# Patient Record
Sex: Male | Born: 1980
Health system: Southern US, Community
[De-identification: ages and names within clinical notes are randomized; demographics above are authoritative.]

## PROBLEM LIST (undated history)

## (undated) DIAGNOSIS — I10 Essential (primary) hypertension: Secondary | ICD-10-CM

## (undated) DIAGNOSIS — IMO0002 Reserved for concepts with insufficient information to code with codable children: Secondary | ICD-10-CM

## (undated) HISTORY — PX: WISDOM TOOTH EXTRACTION: SHX21

---

## 2011-05-31 ENCOUNTER — Inpatient Hospital Stay (INDEPENDENT_AMBULATORY_CARE_PROVIDER_SITE_OTHER)
Admission: RE | Admit: 2011-05-31 | Discharge: 2011-05-31 | Disposition: A | Discharge status: Home / Self Care | Payer: 59 | Source: Ambulatory Visit | Attending: Emergency Medicine | Admitting: Emergency Medicine

## 2011-05-31 DIAGNOSIS — L738 Other specified follicular disorders: Secondary | ICD-10-CM

## 2011-08-15 ENCOUNTER — Emergency Department (HOSPITAL_COMMUNITY)
Admission: EM | Admit: 2011-08-15 | Discharge: 2011-08-15 | Disposition: A | Discharge status: Home / Self Care | Payer: 59 | Attending: Emergency Medicine | Admitting: Emergency Medicine

## 2011-08-15 ENCOUNTER — Other Ambulatory Visit: Payer: Self-pay

## 2011-08-15 ENCOUNTER — Encounter: Payer: Self-pay | Admitting: Emergency Medicine

## 2011-08-15 DIAGNOSIS — J45909 Unspecified asthma, uncomplicated: Secondary | ICD-10-CM | POA: Insufficient documentation

## 2011-08-15 DIAGNOSIS — I10 Essential (primary) hypertension: Secondary | ICD-10-CM | POA: Insufficient documentation

## 2011-08-15 DIAGNOSIS — R059 Cough, unspecified: Secondary | ICD-10-CM | POA: Insufficient documentation

## 2011-08-15 DIAGNOSIS — K921 Melena: Secondary | ICD-10-CM | POA: Insufficient documentation

## 2011-08-15 DIAGNOSIS — R05 Cough: Secondary | ICD-10-CM | POA: Insufficient documentation

## 2011-08-15 DIAGNOSIS — K219 Gastro-esophageal reflux disease without esophagitis: Secondary | ICD-10-CM | POA: Insufficient documentation

## 2011-08-15 DIAGNOSIS — J3489 Other specified disorders of nose and nasal sinuses: Secondary | ICD-10-CM | POA: Insufficient documentation

## 2011-08-15 DIAGNOSIS — K279 Peptic ulcer, site unspecified, unspecified as acute or chronic, without hemorrhage or perforation: Secondary | ICD-10-CM | POA: Insufficient documentation

## 2011-08-15 DIAGNOSIS — Z79899 Other long term (current) drug therapy: Secondary | ICD-10-CM | POA: Insufficient documentation

## 2011-08-15 DIAGNOSIS — R0982 Postnasal drip: Secondary | ICD-10-CM | POA: Insufficient documentation

## 2011-08-15 DIAGNOSIS — J069 Acute upper respiratory infection, unspecified: Secondary | ICD-10-CM | POA: Insufficient documentation

## 2011-08-15 HISTORY — DX: Reserved for concepts with insufficient information to code with codable children: IMO0002

## 2011-08-15 HISTORY — DX: Essential (primary) hypertension: I10

## 2011-08-15 LAB — OCCULT BLOOD, POC DEVICE: Fecal Occult Bld: NEGATIVE

## 2011-08-15 MED ORDER — ALBUTEROL SULFATE HFA 108 (90 BASE) MCG/ACT IN AERS: 2.0000 | INHALATION_SPRAY | RESPIRATORY_TRACT | Status: DC | PRN

## 2011-08-15 NOTE — ED Notes (Signed)
Pt reports congestion x 3 week. Dark tarry stools on Wednesday. Pt denies abdominal pain, N/V.

## 2011-08-15 NOTE — ED Provider Notes (Signed)
History   Patient presents with two weeks of nasal congestion, post nasal drip, and bronchospasm. No fever, chills, SOB, hemoptysis, recent travel or ill contacts. No relief with mucinex.   Patient with hx of GERD vs PUD (no prior GI w/u) has been managed on omeprazole. Noted two episodes of dark tarry stools this week, with normal BM today. No BRBPR, n/v/d. No change in appetite. No abdominal pain at present. No urinary sx  CSN: [id number] Arrival date & time: 08/15/2011  5:04 PM   First MD Initiated Contact with Patient 08/15/11 1908      Chief Complaint  Patient presents with  . Nasal Congestion  . Melena    (Consider location/radiation/quality/duration/timing/severity/associated sxs/prior treatment) Patient is a 30 y.o. male presenting with hematochezia. The history is provided by the patient and the spouse.  Rectal Bleeding  The current episode started 3 to 5 days ago. The onset was sudden. The problem occurs occasionally. The problem has been rapidly improving. The patient is experiencing no pain. The stool is described as soft and hard. Associated symptoms include coughing. Pertinent negatives include no anorexia, no fever, no abdominal pain, no diarrhea, no hematemesis, no hemorrhoids, no nausea, no rectal pain, no vomiting, no hematuria, no chest pain and no difficulty breathing.    Past Medical History  Diagnosis Date  . Asthma   . Hypertension   . Ulcer     History reviewed. No pertinent past surgical history.  History reviewed. No pertinent family history.  History  Substance Use Topics  . Smoking status: Never Smoker   . Smokeless tobacco: Not on file  . Alcohol Use: Yes     Occassional      Review of Systems  Constitutional: Negative for fever, chills, activity change, appetite change and fatigue.  Respiratory: Positive for cough. Negative for chest tightness and shortness of breath.   Cardiovascular: Negative for chest pain.  Gastrointestinal: Positive  for hematochezia. Negative for nausea, vomiting, abdominal pain, diarrhea, abdominal distention, rectal pain, anorexia, hematemesis and hemorrhoids.  Genitourinary: Negative for dysuria, frequency, hematuria, flank pain and difficulty urinating.  Musculoskeletal: Negative for back pain.  Neurological: Negative for syncope.  Hematological: Does not bruise/bleed easily.    Allergies  Review of patient's allergies indicates no known allergies.  Home Medications   Current Outpatient Rx  Name Route Sig Dispense Refill  . SUPER B COMPLEX PO Oral Take 1 tablet by mouth daily.      [name] Kitchen HYDROCHLOROTHIAZIDE 25 MG PO TABS Oral Take 25 mg by mouth daily.      [name] Kitchen LOSARTAN POTASSIUM 50 MG PO TABS Oral Take 50 mg by mouth daily.      Carma Leaven M PLUS PO TABS Oral Take 1 tablet by mouth daily.      [name] Kitchen OMEPRAZOLE 20 MG PO CPDR Oral Take 20 mg by mouth daily.        BP 171/84  Pulse 112  Temp(Src) 98.5 F (36.9 C) (Oral)  Resp 20  SpO2 100%  Physical Exam  Constitutional: He is oriented to person, place, and time. He appears well-developed and well-nourished. No distress.  HENT:  Head: Normocephalic and atraumatic.  Mouth/Throat: Oropharynx is clear and moist. No oropharyngeal exudate.       Cobblestoning of the pharynx with clear postnasal drip noted.  Eyes: Conjunctivae and EOM are normal. Pupils are equal, round, and reactive to light.  Cardiovascular: Normal rate and regular rhythm.   Pulmonary/Chest: Effort normal and breath sounds normal. No respiratory distress.  He has no wheezes. He has no rales. He exhibits no tenderness.  Abdominal: He exhibits no distension and no mass. There is no tenderness. There is no rebound and no guarding.  Genitourinary: Prostate normal. Guaiac negative stool.       Rectal exam normal nontender no hemorrhoids  Musculoskeletal: Normal range of motion. He exhibits no edema and no tenderness.  Neurological: He is alert and oriented to person, place, and time.  Skin:  Skin is warm and dry. No rash noted. He is not diaphoretic. No erythema. No pallor.    ED Course  Procedures (including critical care time)  Labs Reviewed - No data to display No results found.   No diagnosis found.    MDM  Patient HD stable without signs of acute abd or active GI bleed. Hemoccult negative. No indication for further work up including labs or imaging on an emergent basis. Patient encouraged to follow up with GI for possible endoscopy, and he and his wife were comfortable with plan.  URI sx without evidence of PNA or indication for abx. Provided albuterol MDI. Patient to follow up with PMD if symptoms persist.         Marcell Anger, PA 08/16/11 0001

## 2011-08-16 NOTE — ED Provider Notes (Signed)
Medical screening examination/treatment/procedure(s) were performed by non-physician practitioner and as supervising physician I was immediately available for consultation/collaboration.   Lyanne Co, MD 08/16/11 0040

## 2011-08-17 ENCOUNTER — Encounter: Payer: Self-pay | Admitting: Gastroenterology

## 2011-09-11 ENCOUNTER — Ambulatory Visit: Payer: 59 | Admitting: Gastroenterology

## 2011-09-15 ENCOUNTER — Ambulatory Visit: Payer: 59 | Admitting: Gastroenterology

## 2011-12-25 ENCOUNTER — Emergency Department (HOSPITAL_COMMUNITY)
Admission: EM | Admit: 2011-12-25 | Discharge: 2011-12-25 | Disposition: A | Discharge status: Home / Self Care | Payer: 59 | Source: Home / Self Care | Attending: Family Medicine | Admitting: Family Medicine

## 2011-12-25 ENCOUNTER — Encounter (HOSPITAL_COMMUNITY): Payer: Self-pay

## 2011-12-25 ENCOUNTER — Emergency Department (INDEPENDENT_AMBULATORY_CARE_PROVIDER_SITE_OTHER): Payer: 59

## 2011-12-25 DIAGNOSIS — J310 Chronic rhinitis: Secondary | ICD-10-CM

## 2011-12-25 MED ORDER — DOXYCYCLINE HYCLATE 100 MG PO CAPS: 100.0000 mg | ORAL_CAPSULE | Freq: Two times a day (BID) | ORAL | Status: AC

## 2011-12-25 NOTE — ED Provider Notes (Signed)
History     CSN: [id number]  Arrival date & time 12/25/11  1234   First MD Initiated Contact with Patient 12/25/11 1251      Chief Complaint  Patient presents with  . Facial Pain    (Consider location/radiation/quality/duration/timing/severity/associated sxs/prior treatment) Patient is a 31 y.o. male presenting with URI. The history is provided by the patient and a parent.  URI The primary symptoms include cough. Primary symptoms do not include fever, nausea or vomiting. The current episode started more than 1 week ago. This is a new problem. The problem has been gradually worsening (rx for 5 days of omnicef, , cxr neg, still with sinus cong, drainage, cough).  Symptoms associated with the illness include facial pain, sinus pressure, congestion and rhinorrhea.    Past Medical History  Diagnosis Date  . Asthma   . Hypertension   . Ulcer     History reviewed. No pertinent past surgical history.  History reviewed. No pertinent family history.  History  Substance Use Topics  . Smoking status: Never Smoker   . Smokeless tobacco: Not on file  . Alcohol Use: Yes     Occassional      Review of Systems  Constitutional: Negative for fever.  HENT: Positive for congestion, rhinorrhea, postnasal drip and sinus pressure.   Respiratory: Positive for cough.   Gastrointestinal: Negative for nausea and vomiting.    Allergies  Review of patient's allergies indicates no known allergies.  Home Medications   Current Outpatient Rx  Name Route Sig Dispense Refill  . ALBUTEROL SULFATE HFA 108 (90 BASE) MCG/ACT IN AERS Inhalation Inhale 2 puffs into the lungs every 4 (four) hours as needed for wheezing or shortness of breath. 1 Inhaler 0  . SUPER B COMPLEX PO Oral Take 1 tablet by mouth daily.      [name] Kitchen DOXYCYCLINE HYCLATE 100 MG PO CAPS Oral Take 1 capsule (100 mg total) by mouth 2 (two) times daily. 20 capsule 1  . HYDROCHLOROTHIAZIDE 25 MG PO TABS Oral Take 25 mg by mouth daily.        [name] Kitchen LOSARTAN POTASSIUM 50 MG PO TABS Oral Take 50 mg by mouth daily.      Carma Leaven M PLUS PO TABS Oral Take 1 tablet by mouth daily.      [name] Kitchen OMEPRAZOLE 20 MG PO CPDR Oral Take 20 mg by mouth daily.        BP 137/77  Pulse 82  Temp(Src) 98.1 F (36.7 C) (Oral)  Resp 16  SpO2 96%  Physical Exam  Nursing note and vitals reviewed. Constitutional: He is oriented to person, place, and time. He appears well-developed and well-nourished.  HENT:  Head: Normocephalic.  Right Ear: External ear normal.  Left Ear: External ear normal.  Nose: Mucosal edema present. Right sinus exhibits maxillary sinus tenderness and frontal sinus tenderness. Left sinus exhibits maxillary sinus tenderness and frontal sinus tenderness.  Mouth/Throat: Oropharynx is clear and moist.  Eyes: Conjunctivae are normal. Pupils are equal, round, and reactive to light.  Neck: Normal range of motion. Neck supple.  Cardiovascular: Normal rate, normal heart sounds and intact distal pulses.   Pulmonary/Chest: Effort normal and breath sounds normal.  Lymphadenopathy:    He has no cervical adenopathy.  Neurological: He is alert and oriented to person, place, and time.  Skin: Skin is warm and dry.  Psychiatric: He has a normal mood and affect.    ED Course  Procedures (including critical care time)  Labs Reviewed -  No data to display No results found.   1. Purulent rhinitis       MDM  X-rays reviewed and report per radiologist.         Linna Hoff, MD 12/30/11 2049

## 2011-12-25 NOTE — ED Notes (Signed)
States he has been having issues for a month w HA, sinus pressure, clear/green secretions, dizziness; saw his primary(Daniel Jarold Motto) and had round of omnicef and a chest x-ray, w/o resolution of issues

## 2012-10-26 ENCOUNTER — Other Ambulatory Visit: Payer: Self-pay | Admitting: Orthopedic Surgery

## 2012-10-26 ENCOUNTER — Ambulatory Visit
Admission: RE | Admit: 2012-10-26 | Discharge: 2012-10-26 | Disposition: A | Discharge status: Home / Self Care | Payer: 59 | Source: Ambulatory Visit | Attending: Orthopedic Surgery | Admitting: Orthopedic Surgery

## 2012-10-26 DIAGNOSIS — M545 Low back pain: Secondary | ICD-10-CM

## 2012-10-28 ENCOUNTER — Other Ambulatory Visit: Payer: Self-pay | Admitting: Orthopedic Surgery

## 2012-10-31 ENCOUNTER — Encounter (HOSPITAL_COMMUNITY): Payer: Self-pay | Admitting: Pharmacy Technician

## 2012-11-01 ENCOUNTER — Encounter (HOSPITAL_COMMUNITY): Payer: Self-pay | Admitting: *Deleted

## 2012-11-01 ENCOUNTER — Other Ambulatory Visit (HOSPITAL_COMMUNITY): Payer: 59

## 2012-11-01 MED ORDER — CEFAZOLIN SODIUM-DEXTROSE 2-3 GM-% IV SOLR
2.0000 g | INTRAVENOUS | Status: AC
  Administered 2012-11-02: 2 g via INTRAVENOUS
  Filled 2012-11-01: qty 50

## 2012-11-02 ENCOUNTER — Ambulatory Visit (HOSPITAL_COMMUNITY): Payer: 59 | Admitting: Anesthesiology

## 2012-11-02 ENCOUNTER — Encounter (HOSPITAL_COMMUNITY)
Admission: RE | Disposition: A | Discharge status: Home / Self Care | Payer: Self-pay | Source: Ambulatory Visit | Attending: Orthopedic Surgery

## 2012-11-02 ENCOUNTER — Ambulatory Visit (HOSPITAL_COMMUNITY): Payer: 59

## 2012-11-02 ENCOUNTER — Ambulatory Visit (HOSPITAL_COMMUNITY)
Admission: RE | Admit: 2012-11-02 | Discharge: 2012-11-02 | Disposition: A | Discharge status: Home / Self Care | Payer: 59 | Source: Ambulatory Visit | Attending: Orthopedic Surgery | Admitting: Orthopedic Surgery

## 2012-11-02 ENCOUNTER — Encounter (HOSPITAL_COMMUNITY): Payer: Self-pay | Admitting: Anesthesiology

## 2012-11-02 DIAGNOSIS — M5126 Other intervertebral disc displacement, lumbar region: Secondary | ICD-10-CM | POA: Insufficient documentation

## 2012-11-02 DIAGNOSIS — I10 Essential (primary) hypertension: Secondary | ICD-10-CM | POA: Insufficient documentation

## 2012-11-02 DIAGNOSIS — Z79899 Other long term (current) drug therapy: Secondary | ICD-10-CM | POA: Insufficient documentation

## 2012-11-02 DIAGNOSIS — J45909 Unspecified asthma, uncomplicated: Secondary | ICD-10-CM | POA: Insufficient documentation

## 2012-11-02 HISTORY — PX: LUMBAR LAMINECTOMY/DECOMPRESSION MICRODISCECTOMY: SHX5026

## 2012-11-02 LAB — COMPREHENSIVE METABOLIC PANEL
ALT: 38 U/L (ref 0–53)
Albumin: 3.7 g/dL (ref 3.5–5.2)
Alkaline Phosphatase: 85 U/L (ref 39–117)
BUN: 11 mg/dL (ref 6–23)
Potassium: 3.5 mEq/L (ref 3.5–5.1)
Sodium: 139 mEq/L (ref 135–145)
Total Protein: 7.6 g/dL (ref 6.0–8.3)

## 2012-11-02 LAB — CBC WITH DIFFERENTIAL/PLATELET
Basophils Absolute: 0.1 10*3/uL (ref 0.0–0.1)
Basophils Relative: 1 % (ref 0–1)
Eosinophils Absolute: 0.2 10*3/uL (ref 0.0–0.7)
MCH: 29.4 pg (ref 26.0–34.0)
MCHC: 34.8 g/dL (ref 30.0–36.0)
Neutrophils Relative %: 59 % (ref 43–77)
Platelets: 261 10*3/uL (ref 150–400)
RDW: 13.6 % (ref 11.5–15.5)

## 2012-11-02 LAB — TYPE AND SCREEN: Antibody Screen: NEGATIVE

## 2012-11-02 LAB — SURGICAL PCR SCREEN
MRSA, PCR: NEGATIVE
Staphylococcus aureus: NEGATIVE

## 2012-11-02 LAB — PROTIME-INR: Prothrombin Time: 12.4 seconds (ref 11.6–15.2)

## 2012-11-02 SURGERY — LUMBAR LAMINECTOMY/DECOMPRESSION MICRODISCECTOMY
Anesthesia: General | Site: Spine Lumbar | Laterality: Left | Wound class: Clean

## 2012-11-02 MED ORDER — PROMETHAZINE HCL 25 MG/ML IJ SOLN
6.2500 mg | Freq: Once | INTRAMUSCULAR | Status: AC
  Administered 2012-11-02: 6.25 mg via INTRAVENOUS

## 2012-11-02 MED ORDER — LIDOCAINE HCL (CARDIAC) 20 MG/ML IV SOLN
INTRAVENOUS | Status: DC | PRN
  Administered 2012-11-02: 100 mg via INTRAVENOUS

## 2012-11-02 MED ORDER — MIDAZOLAM HCL 5 MG/5ML IJ SOLN
INTRAMUSCULAR | Status: DC | PRN
  Administered 2012-11-02: 2 mg via INTRAVENOUS

## 2012-11-02 MED ORDER — OXYCODONE HCL 5 MG PO TABS: 5.0000 mg | ORAL_TABLET | Freq: Once | ORAL | Status: DC | PRN

## 2012-11-02 MED ORDER — LIDOCAINE HCL 4 % MT SOLN
OROMUCOSAL | Status: DC | PRN
  Administered 2012-11-02: 4 mL via TOPICAL

## 2012-11-02 MED ORDER — PHENYLEPHRINE HCL 10 MG/ML IJ SOLN
10.0000 mg | INTRAVENOUS | Status: DC | PRN
  Administered 2012-11-02: 20 ug/min via INTRAVENOUS

## 2012-11-02 MED ORDER — LACTATED RINGERS IV SOLN
INTRAVENOUS | Status: DC | PRN
  Administered 2012-11-02 (×3): via INTRAVENOUS

## 2012-11-02 MED ORDER — PHENOL 1.4 % MT LIQD: 1.0000 | OROMUCOSAL | Status: DC | PRN

## 2012-11-02 MED ORDER — MENTHOL 3 MG MT LOZG: 1.0000 | LOZENGE | OROMUCOSAL | Status: DC | PRN

## 2012-11-02 MED ORDER — HEMOSTATIC AGENTS (NO CHARGE) OPTIME
TOPICAL | Status: DC | PRN
  Administered 2012-11-02: 1 via TOPICAL

## 2012-11-02 MED ORDER — ARTIFICIAL TEARS OP OINT
TOPICAL_OINTMENT | OPHTHALMIC | Status: DC | PRN
  Administered 2012-11-02: 1 via OPHTHALMIC

## 2012-11-02 MED ORDER — ALBUMIN HUMAN 5 % IV SOLN
INTRAVENOUS | Status: DC | PRN
  Administered 2012-11-02: 14:00:00 via INTRAVENOUS

## 2012-11-02 MED ORDER — OXYCODONE HCL 5 MG/5ML PO SOLN
5.0000 mg | Freq: Once | ORAL | Status: DC | PRN
Start: 2012-11-02 — End: 2012-11-02

## 2012-11-02 MED ORDER — HYDROMORPHONE HCL PF 1 MG/ML IJ SOLN
0.2500 mg | INTRAMUSCULAR | Status: DC | PRN
  Administered 2012-11-02 (×2): 0.5 mg via INTRAVENOUS

## 2012-11-02 MED ORDER — PROPOFOL 10 MG/ML IV BOLUS
INTRAVENOUS | Status: DC | PRN
  Administered 2012-11-02: 200 mg via INTRAVENOUS

## 2012-11-02 MED ORDER — ONDANSETRON HCL 4 MG/2ML IJ SOLN
INTRAMUSCULAR | Status: DC | PRN
  Administered 2012-11-02: 4 mg via INTRAVENOUS

## 2012-11-02 MED ORDER — THROMBIN 20000 UNITS EX SOLR
CUTANEOUS | Status: DC | PRN
  Administered 2012-11-02: 14:00:00 via TOPICAL

## 2012-11-02 MED ORDER — METHYLPREDNISOLONE ACETATE 40 MG/ML IJ SUSP
INTRAMUSCULAR | Status: DC | PRN
  Administered 2012-11-02: 1 mL

## 2012-11-02 MED ORDER — NEOSTIGMINE METHYLSULFATE 1 MG/ML IJ SOLN
INTRAMUSCULAR | Status: DC | PRN
  Administered 2012-11-02: 5 mg via INTRAVENOUS

## 2012-11-02 MED ORDER — EPHEDRINE SULFATE 50 MG/ML IJ SOLN
INTRAMUSCULAR | Status: DC | PRN
  Administered 2012-11-02 (×3): 5 mg via INTRAVENOUS

## 2012-11-02 MED ORDER — LACTATED RINGERS IV SOLN
INTRAVENOUS | Status: DC
  Administered 2012-11-02: 10:00:00 via INTRAVENOUS

## 2012-11-02 MED ORDER — ACETAMINOPHEN 10 MG/ML IV SOLN
1000.0000 mg | Freq: Once | INTRAVENOUS | Status: DC
  Filled 2012-11-02: qty 100

## 2012-11-02 MED ORDER — ROCURONIUM BROMIDE 100 MG/10ML IV SOLN
INTRAVENOUS | Status: DC | PRN
  Administered 2012-11-02: 50 mg via INTRAVENOUS
  Administered 2012-11-02 (×2): 20 mg via INTRAVENOUS

## 2012-11-02 MED ORDER — FENTANYL CITRATE 0.05 MG/ML IJ SOLN
INTRAMUSCULAR | Status: DC | PRN
  Administered 2012-11-02: 150 ug via INTRAVENOUS
  Administered 2012-11-02: 50 ug via INTRAVENOUS

## 2012-11-02 MED ORDER — BUPIVACAINE-EPINEPHRINE 0.25% -1:200000 IJ SOLN
INTRAMUSCULAR | Status: DC | PRN
  Administered 2012-11-02: 14 mL

## 2012-11-02 MED ORDER — MUPIROCIN 2 % EX OINT
TOPICAL_OINTMENT | Freq: Two times a day (BID) | CUTANEOUS | Status: DC
  Filled 2012-11-02 (×2): qty 22

## 2012-11-02 MED ORDER — INDIGOTINDISULFONATE SODIUM 8 MG/ML IJ SOLN
INTRAMUSCULAR | Status: DC | PRN
  Administered 2012-11-02: 1 mL via INTRAVENOUS

## 2012-11-02 MED ORDER — POVIDONE-IODINE 7.5 % EX SOLN
Freq: Once | CUTANEOUS | Status: DC
  Filled 2012-11-02: qty 118

## 2012-11-02 MED ORDER — PROMETHAZINE HCL 25 MG/ML IJ SOLN
INTRAMUSCULAR | Status: DC
Start: 2012-11-02 — End: 2012-11-02
  Filled 2012-11-02: qty 1

## 2012-11-02 MED ORDER — ACETAMINOPHEN 10 MG/ML IV SOLN
INTRAVENOUS | Status: AC
  Administered 2012-11-02: 1000 mg via INTRAVENOUS
  Filled 2012-11-02: qty 100

## 2012-11-02 MED ORDER — GLYCOPYRROLATE 0.2 MG/ML IJ SOLN
INTRAMUSCULAR | Status: DC | PRN
  Administered 2012-11-02: 0.6 mg via INTRAVENOUS

## 2012-11-02 MED ORDER — MIDAZOLAM HCL 5 MG/5ML IJ SOLN: INTRAMUSCULAR | Status: DC | PRN

## 2012-11-02 MED ORDER — PHENYLEPHRINE HCL 10 MG/ML IJ SOLN
INTRAMUSCULAR | Status: DC | PRN
  Administered 2012-11-02 (×3): 40 ug via INTRAVENOUS
  Administered 2012-11-02: 80 ug via INTRAVENOUS
  Administered 2012-11-02 (×3): 40 ug via INTRAVENOUS

## 2012-11-02 MED ORDER — HYDROMORPHONE HCL PF 1 MG/ML IJ SOLN
INTRAMUSCULAR | Status: AC
  Filled 2012-11-02: qty 1

## 2012-11-02 SURGICAL SUPPLY — 66 items
BENZOIN TINCTURE PRP APPL 2/3 (GAUZE/BANDAGES/DRESSINGS) ×2 IMPLANT
BUR ROUND PRECISION 4.0 (BURR) ×2 IMPLANT
CANISTER SUCTION 2500CC (MISCELLANEOUS) ×2 IMPLANT
CLOTH BEACON ORANGE TIMEOUT ST (SAFETY) ×2 IMPLANT
CLSR STERI-STRIP ANTIMIC 1/2X4 (GAUZE/BANDAGES/DRESSINGS) ×2 IMPLANT
CORDS BIPOLAR (ELECTRODE) ×2 IMPLANT
COVER SURGICAL LIGHT HANDLE (MISCELLANEOUS) ×2 IMPLANT
DRAIN CHANNEL 15F RND FF W/TCR (WOUND CARE) IMPLANT
DRAPE POUCH INSTRU U-SHP 10X18 (DRAPES) ×2 IMPLANT
DRAPE SURG 17X23 STRL (DRAPES) ×6 IMPLANT
DURAPREP 26ML APPLICATOR (WOUND CARE) ×2 IMPLANT
ELECT BLADE 4.0 EZ CLEAN MEGAD (MISCELLANEOUS) ×2
ELECT CAUTERY BLADE 6.4 (BLADE) ×2 IMPLANT
ELECT REM PT RETURN 9FT ADLT (ELECTROSURGICAL) ×2
ELECTRODE BLDE 4.0 EZ CLN MEGD (MISCELLANEOUS) ×1 IMPLANT
ELECTRODE REM PT RTRN 9FT ADLT (ELECTROSURGICAL) ×1 IMPLANT
EVACUATOR SILICONE 100CC (DRAIN) IMPLANT
FILTER STRAW FLUID ASPIR (MISCELLANEOUS) ×2 IMPLANT
GAUZE SPONGE 4X4 16PLY XRAY LF (GAUZE/BANDAGES/DRESSINGS) ×2 IMPLANT
GLOVE BIO SURGEON STRL SZ7 (GLOVE) ×2 IMPLANT
GLOVE BIO SURGEON STRL SZ7.5 (GLOVE) ×2 IMPLANT
GLOVE BIO SURGEON STRL SZ8 (GLOVE) ×4 IMPLANT
GLOVE BIOGEL PI IND STRL 8 (GLOVE) ×2 IMPLANT
GLOVE BIOGEL PI INDICATOR 8 (GLOVE) ×2
GLOVE BIOGEL PI ORTHO PRO SZ7 (GLOVE) ×1
GLOVE PI ORTHO PRO STRL SZ7 (GLOVE) ×1 IMPLANT
GLOVE SURG SS PI 7.0 STRL IVOR (GLOVE) ×4 IMPLANT
GOWN PREVENTION PLUS LG XLONG (DISPOSABLE) IMPLANT
GOWN PREVENTION PLUS XLARGE (GOWN DISPOSABLE) ×2 IMPLANT
GOWN STRL NON-REIN LRG LVL3 (GOWN DISPOSABLE) ×2 IMPLANT
GOWN STRL REIN XL XLG (GOWN DISPOSABLE) ×4 IMPLANT
IV CATH 14GX2 1/4 (CATHETERS) ×2 IMPLANT
KIT BASIN OR (CUSTOM PROCEDURE TRAY) ×2 IMPLANT
KIT ROOM TURNOVER OR (KITS) ×2 IMPLANT
NEEDLE 18GX1X1/2 (RX/OR ONLY) (NEEDLE) ×2 IMPLANT
NEEDLE 22X1 1/2 (OR ONLY) (NEEDLE) IMPLANT
NEEDLE HYPO 25GX1X1/2 BEV (NEEDLE) ×2 IMPLANT
NEEDLE SPNL 18GX3.5 QUINCKE PK (NEEDLE) ×4 IMPLANT
NS IRRIG 1000ML POUR BTL (IV SOLUTION) ×2 IMPLANT
PACK LAMINECTOMY ORTHO (CUSTOM PROCEDURE TRAY) ×2 IMPLANT
PACK UNIVERSAL I (CUSTOM PROCEDURE TRAY) ×2 IMPLANT
PAD ARMBOARD 7.5X6 YLW CONV (MISCELLANEOUS) ×4 IMPLANT
PATTIES SURGICAL .5 X.5 (GAUZE/BANDAGES/DRESSINGS) ×2 IMPLANT
PATTIES SURGICAL .5 X1 (DISPOSABLE) ×2 IMPLANT
SPONGE GAUZE 4X4 12PLY (GAUZE/BANDAGES/DRESSINGS) ×2 IMPLANT
SPONGE SURGIFOAM ABS GEL 100 (HEMOSTASIS) ×2 IMPLANT
STRIP CLOSURE SKIN 1/2X4 (GAUZE/BANDAGES/DRESSINGS) IMPLANT
SURGIFLO TRUKIT (HEMOSTASIS) ×2 IMPLANT
SUT MNCRL AB 4-0 PS2 18 (SUTURE) ×2 IMPLANT
SUT VIC AB 0 CT1 18XCR BRD 8 (SUTURE) IMPLANT
SUT VIC AB 0 CT1 27 (SUTURE)
SUT VIC AB 0 CT1 27XBRD ANBCTR (SUTURE) IMPLANT
SUT VIC AB 0 CT1 8-18 (SUTURE)
SUT VIC AB 1 CT1 18XCR BRD 8 (SUTURE) ×1 IMPLANT
SUT VIC AB 1 CT1 8-18 (SUTURE) ×1
SUT VIC AB 2-0 CT2 18 VCP726D (SUTURE) ×2 IMPLANT
SYR 20CC LL (SYRINGE) IMPLANT
SYR BULB IRRIGATION 50ML (SYRINGE) ×2 IMPLANT
SYR CONTROL 10ML LL (SYRINGE) ×6 IMPLANT
SYR TB 1ML 26GX3/8 SAFETY (SYRINGE) IMPLANT
SYR TB 1ML LUER SLIP (SYRINGE) ×4 IMPLANT
TAPE CLOTH SURG 6X10 WHT LF (GAUZE/BANDAGES/DRESSINGS) ×2 IMPLANT
TOWEL OR 17X24 6PK STRL BLUE (TOWEL DISPOSABLE) ×2 IMPLANT
TOWEL OR 17X26 10 PK STRL BLUE (TOWEL DISPOSABLE) ×2 IMPLANT
WATER STERILE IRR 1000ML POUR (IV SOLUTION) ×2 IMPLANT
YANKAUER SUCT BULB TIP NO VENT (SUCTIONS) ×2 IMPLANT

## 2012-11-02 NOTE — Preoperative (Signed)
Beta Blockers   Reason not to administer Beta Blockers:Not Applicable 

## 2012-11-02 NOTE — H&P (Signed)
PREOPERATIVE H&P  Chief Complaint: Left leg pain  HPI: Sean Olson is a 32 y.o. male who presents with severe left leg pain x 1 month  Past Medical History  Diagnosis Date  . Asthma   . Hypertension   . Ulcer    Past Surgical History  Procedure Laterality Date  . Wisdom tooth extraction     History   Social History  . Marital Status: Married    Spouse Name: N/A    Number of Children: N/A  . Years of Education: N/A   Social History Main Topics  . Smoking status: Never Smoker   . Smokeless tobacco: None  . Alcohol Use: Yes     Comment: Occassional  . Drug Use: No  . Sexually Active: None   Other Topics Concern  . None   Social History Narrative  . None   History reviewed. No pertinent family history. No Known Allergies Prior to Admission medications   Medication Sig Start Date End Date Taking? Authorizing Provider  amLODipine (NORVASC) 5 MG tablet Take 5 mg by mouth daily.   Yes Historical Provider, MD  B Complex-C (SUPER B COMPLEX PO) Take 1 tablet by mouth daily.     Yes Historical Provider, MD  cyclobenzaprine (FLEXERIL) 10 MG tablet Take 10-20 mg by mouth 3 (three) times daily as needed for muscle spasms.   Yes Historical Provider, MD  hydrochlorothiazide (HYDRODIURIL) 25 MG tablet Take 25 mg by mouth daily.     Yes Historical Provider, MD  HYDROcodone-acetaminophen (NORCO/VICODIN) 5-325 MG per tablet Take 1-2 tablets by mouth every 6 (six) hours as needed for pain.   Yes Historical Provider, MD  meloxicam (MOBIC) 15 MG tablet Take 15 mg by mouth daily.   Yes Historical Provider, MD  Multiple Vitamins-Minerals (MULTIVITAMINS THER. W/MINERALS) TABS Take 1 tablet by mouth daily.     Yes Historical Provider, MD  olmesartan (BENICAR) 20 MG tablet Take 20 mg by mouth daily.   Yes Historical Provider, MD  omeprazole (PRILOSEC) 20 MG capsule Take 20 mg by mouth daily.     Yes Historical Provider, MD  pravastatin (PRAVACHOL) 20 MG tablet Take 20 mg by mouth daily.    Yes Historical Provider, MD  Probiotic Product (PROBIOTIC DAILY PO) Take 1 capsule by mouth daily.   Yes Historical Provider, MD     All other systems have been reviewed and were otherwise negative with the exception of those mentioned in the HPI and as above.  Physical Exam: There were no vitals filed for this visit.  General: Alert, no acute distress Cardiovascular: No pedal edema Respiratory: No cyanosis, no use of accessory musculature GI: No organomegaly, abdomen is soft and non-tender Skin: No lesions in the area of chief complaint Neurologic: Sensation intact distally Psychiatric: Patient is competent for consent with normal mood and affect Lymphatic: No axillary or cervical lymphadenopathy  MUSCULOSKELETAL: + SLR on left  Assessment/Plan: Left leg pain Plan for Procedure(s): MICRODISCECTOMY L5/S1 on left   Emilee Hero, MD 11/02/2012 8:08 AM

## 2012-11-02 NOTE — Transfer of Care (Signed)
Immediate Anesthesia Transfer of Care Note  Patient: Sean Olson  Procedure(s) Performed: Procedure(s) with comments: LUMBAR LAMINECTOMY/DECOMPRESSION MICRODISCECTOMY (Left) - Left sided lumbar 5-sacrum 1 microdisectomy  Patient Location: PACU  Anesthesia Type:General  Level of Consciousness: awake, alert  and oriented  Airway & Oxygen Therapy: Patient Spontanous Breathing  Post-op Assessment: Report given to PACU RN, Post -op Vital signs reviewed and stable and Patient moving all extremities X 4  Post vital signs: Reviewed and stable  Complications: No apparent anesthesia complications

## 2012-11-02 NOTE — Anesthesia Postprocedure Evaluation (Signed)
  Anesthesia Post-op Note  Patient: Sean Olson  Procedure(s) Performed: Procedure(s) with comments: LUMBAR LAMINECTOMY/DECOMPRESSION MICRODISCECTOMY (Left) - Left sided lumbar 5-sacrum 1 microdisectomy  Patient Location: PACU  Anesthesia Type:General  Level of Consciousness: awake and alert   Airway and Oxygen Therapy: Patient Spontanous Breathing  Post-op Pain: mild  Post-op Assessment: Post-op Vital signs reviewed, Patient's Cardiovascular Status Stable, Respiratory Function Stable and Patent Airway  Post-op Vital Signs: Reviewed and stable  Complications: No apparent anesthesia complications

## 2012-11-02 NOTE — Anesthesia Procedure Notes (Signed)
Procedure Name: Intubation Date/Time: 11/02/2012 12:59 PM Performed by: Lovie Chol Pre-anesthesia Checklist: Emergency Drugs available, Suction available, Timeout performed, Patient identified and Patient being monitored Patient Re-evaluated:Patient Re-evaluated prior to inductionOxygen Delivery Method: Circle system utilized Preoxygenation: Pre-oxygenation with 100% oxygen Intubation Type: IV induction Ventilation: Mask ventilation without difficulty Laryngoscope Size: Miller and 2 Grade View: Grade I Tube type: Oral Tube size: 7.5 mm Number of attempts: 1 Airway Equipment and Method: Stylet and LTA kit utilized Placement Confirmation: ETT inserted through vocal cords under direct vision,  positive ETCO2,  CO2 detector and breath sounds checked- equal and bilateral Secured at: 22 cm Tube secured with: Tape Dental Injury: Teeth and Oropharynx as per pre-operative assessment

## 2012-11-02 NOTE — Anesthesia Preprocedure Evaluation (Addendum)
Anesthesia Evaluation  Patient identified by MRN, date of birth, ID band Patient awake    Reviewed: Allergy & Precautions, H&P , NPO status , Patient's Chart, lab work & pertinent test results  History of Anesthesia Complications Negative for: history of anesthetic complications  Airway Mallampati: I TM Distance: >3 FB Neck ROM: Full    Dental no notable dental hx. (+) Teeth Intact and Dental Advisory Given   Pulmonary neg pulmonary ROS,  breath sounds clear to auscultation  Pulmonary exam normal       Cardiovascular hypertension, On Medications Rhythm:Regular Rate:Normal     Neuro/Psych negative neurological ROS  negative psych ROS   GI/Hepatic Neg liver ROS, GERD-  Medicated and Controlled,  Endo/Other  negative endocrine ROS  Renal/GU negative Renal ROS  negative genitourinary   Musculoskeletal   Abdominal   Peds  Hematology negative hematology ROS (+)   Anesthesia Other Findings   Reproductive/Obstetrics negative OB ROS                          Anesthesia Physical Anesthesia Plan  ASA: II  Anesthesia Plan: General   Post-op Pain Management:    Induction: Intravenous  Airway Management Planned: Oral ETT  Additional Equipment:   Intra-op Plan:   Post-operative Plan: Extubation in OR  Informed Consent: I have reviewed the patients History and Physical, chart, labs and discussed the procedure including the risks, benefits and alternatives for the proposed anesthesia with the patient or authorized representative who has indicated his/her understanding and acceptance.   Dental advisory given  Plan Discussed with: CRNA  Anesthesia Plan Comments:         Anesthesia Quick Evaluation

## 2012-11-03 ENCOUNTER — Encounter (HOSPITAL_COMMUNITY): Payer: Self-pay | Admitting: Orthopedic Surgery

## 2012-11-03 NOTE — Op Note (Signed)
NAMECHRISTINO, Sean Olson NO.:  [id number]  MEDICAL RECORD NO.:  [id number]  LOCATION:  [facility]                         FACILITY:  [facility]  PHYSICIAN:  Estill Bamberg, MD      DATE OF BIRTH:  17-Jun-1981  DATE OF PROCEDURE:  11/02/2012 DATE OF DISCHARGE:  11/02/2012                              OPERATIVE REPORT   PREOPERATIVE DIAGNOSES: 1. Left-sided S1 radiculopathy. 2. Very large extruded L5-S1 disk fragments, causing left-sided S1     radiculopathy.  POSTOPERATIVE DIAGNOSES: 1. Left-sided S1 radiculopathy. 2. Very large extruded L5-S1 disk fragments, causing left-sided S1     radiculopathy.  PROCEDURE:  Left-sided L5-S1 microdiskectomy.  SURGEON:  Estill Bamberg, MD  ASSISTANT:  Georjean Mode, [facility]  ANESTHESIA:  General endotracheal anesthesia.  COMPLICATIONS:  None.  DISPOSITION:  Stable.  ESTIMATED BLOOD LOSS:  Minimal.  INDICATIONS FOR PROCEDURE:  Briefly, Sean Olson is a very pleasant 32 year old male who did present to me initially with low back pain as well as bilateral buttock pain.  This did progress onto him having rather severe pain in the left leg.  He did also have weakness.  I did obtain an MRI which was notable for a left-sided L5-S1 disk herniation.  Of note, intraoperatively, there was a very large extruded disk fragment which was very much underestimated by the MRI, dated October 26, 2012.  In any event, the patient did have ongoing pain.  We did discuss additional conservative treatment options versus surgery, and the patient did wish to go forward with surgical intervention.  The patient fully understood the risks and limitations of the procedure as outlined in my preoperative note.  OPERATIVE DETAILS:  On November 02, 2012, the patient was brought to the surgery and general endotracheal anesthesia was administered.  The patient was placed prone on a well-padded flat Jackson bed with a spinal frame.  Antibiotics were given.  The back  was prepped and draped in the usual sterile fashion and a time-out procedure was performed.  I then made an incision overlying the L5-S1 interspace.  The lamina of L5 and S1 was readily identified.  A lateral intraoperative radiograph did confirm the appropriate operative level.  I did use a high-speed burr to remove the inferior medial aspect at the L5 lamina.  I then used a curette to tease away the ligamentum flavum from the superior aspect of S1.  The lateral aspect of the ligamentum flavum was removed in its entirety.  Of note, upon initially entering through the ligamentum flavum, a very large irregular soft tissue mass was apparent.  I did very gently use a nerve hook to dissect away circumferentially around the structure.  It was readily apparent that this was an extruded very large disk fragment.  I then used pituitary again.  The disk fragment was removed in 1 extremely large piece.  I then continued my dissection. I was ultimately able to identify the traversing S1 nerve.  The posterolateral aspect of the ruptured disk was readily noted.  I then had an assistant to hold medial retraction of the traversing S1 nerve. I did continue to explore the posterolateral aspect of the disk space. I did continue to  be additional posterior protruded fragments which were removed.  I did again explore the posterolateral aspect of the disk and it was obvious that the rupture of the intervertebral disk did result in a rather significant annulotomy defect in the posterolateral area.  I did remove remaining fragments that were causing compromise of the lateral recess.  I then liberally irrigated the wound.  I then explored the epidural space and there was no undue bleeding and there was no undue pressure on the traversing S1 nerve.  I did not do go forward with any additional traumatization to the disk given the patient's young age. There was a traumatic disruption of the annulus, there was no  additional pressure on the nerve and no more dissection or surgery was needed at this point.  I then controlled epidural bleeding using Surgiflo and bone wax.  I then infiltrated 40 mg of Depo-Medrol about the epidural space. The fascia was then closed using #1 Vicryl.  The deep subcutaneous layer was closed using 2-0 Vicryl and the skin was closed using 3-0 Monocryl. Benzoin and Steri-Strips were applied followed by sterile dressing.  The patient was then awoken from general endotracheal anesthesia and transferred to the recovery room in stable condition.     Estill Bamberg, MD     MD/MEDQ  D:  11/02/2012  T:  11/03/2012  Job:  [id number]

## 2014-03-17 IMAGING — CR DG LUMBAR SPINE 2-3V
2 series · 2 of 2 positions shown · non-contrast
Comparison: MRI 10/26/2012

CLINICAL DATA: L5-S1 microdiskectomy.

LUMBAR SPINE - 2-3 VIEW

[lateral (1 of 2)]
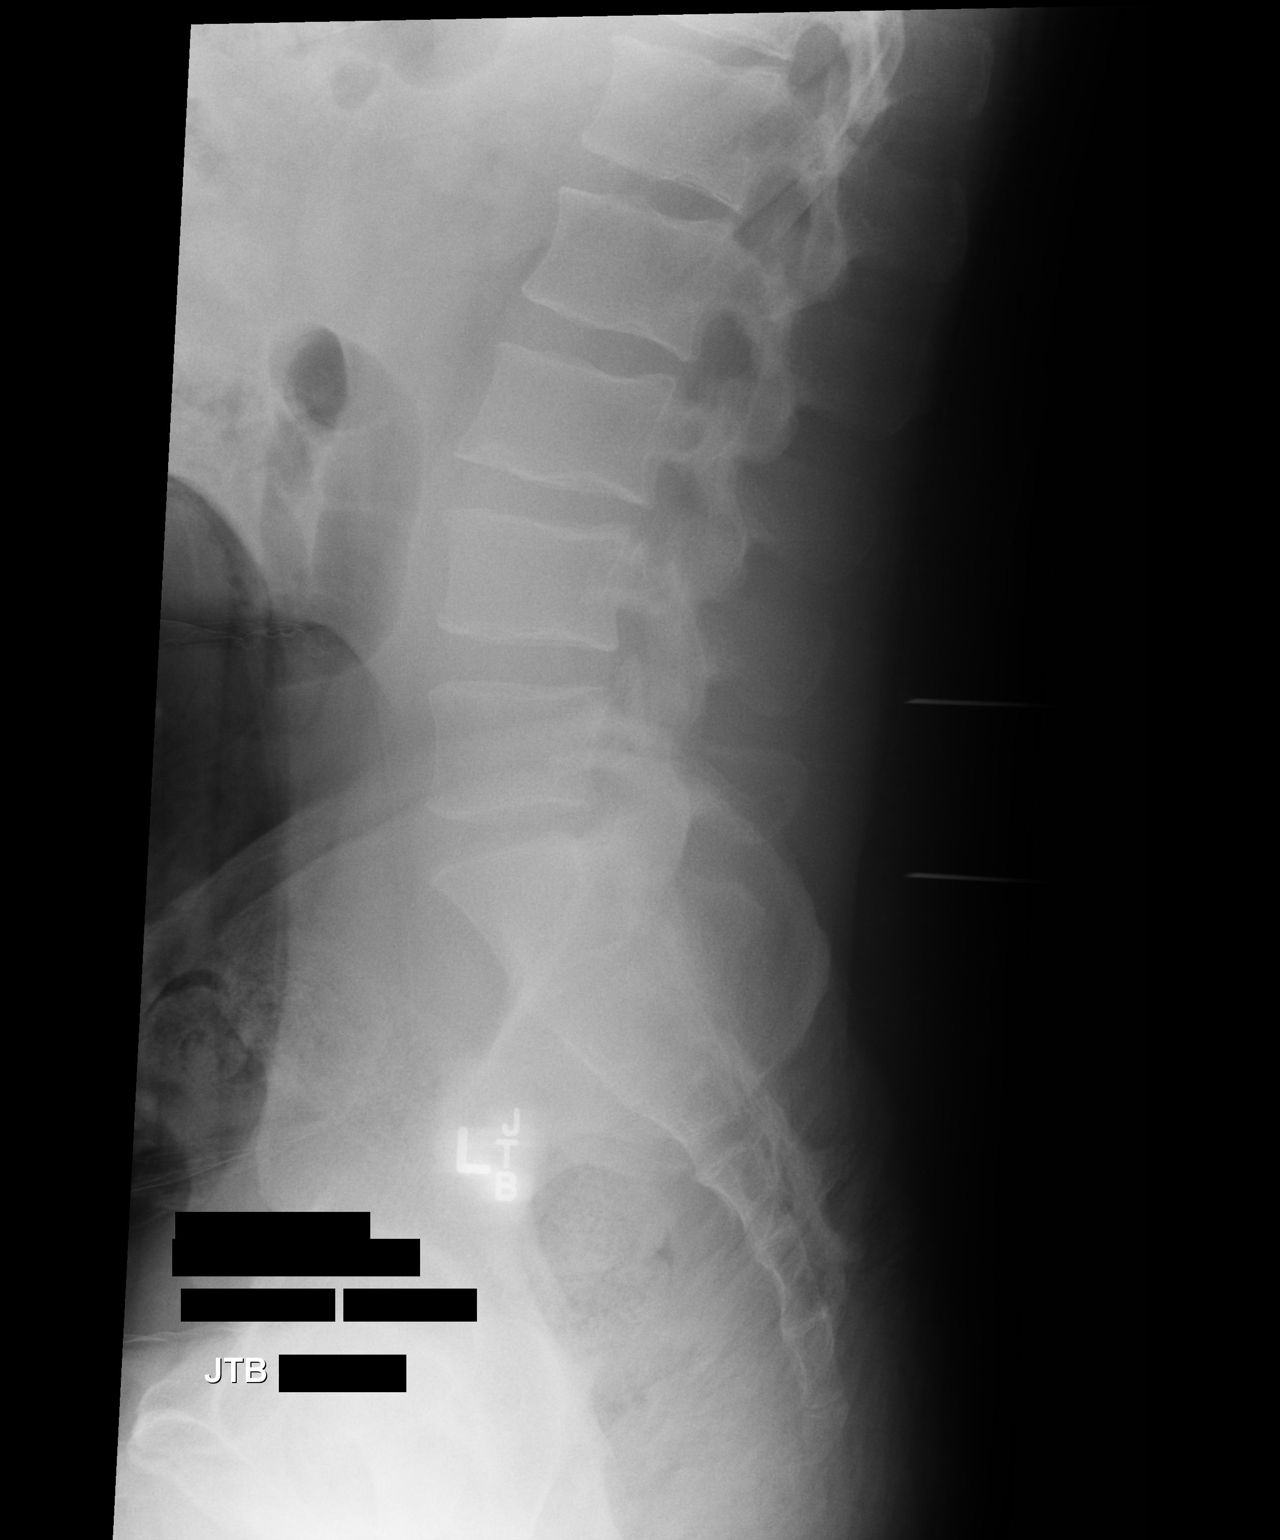

[lateral (2 of 2)]
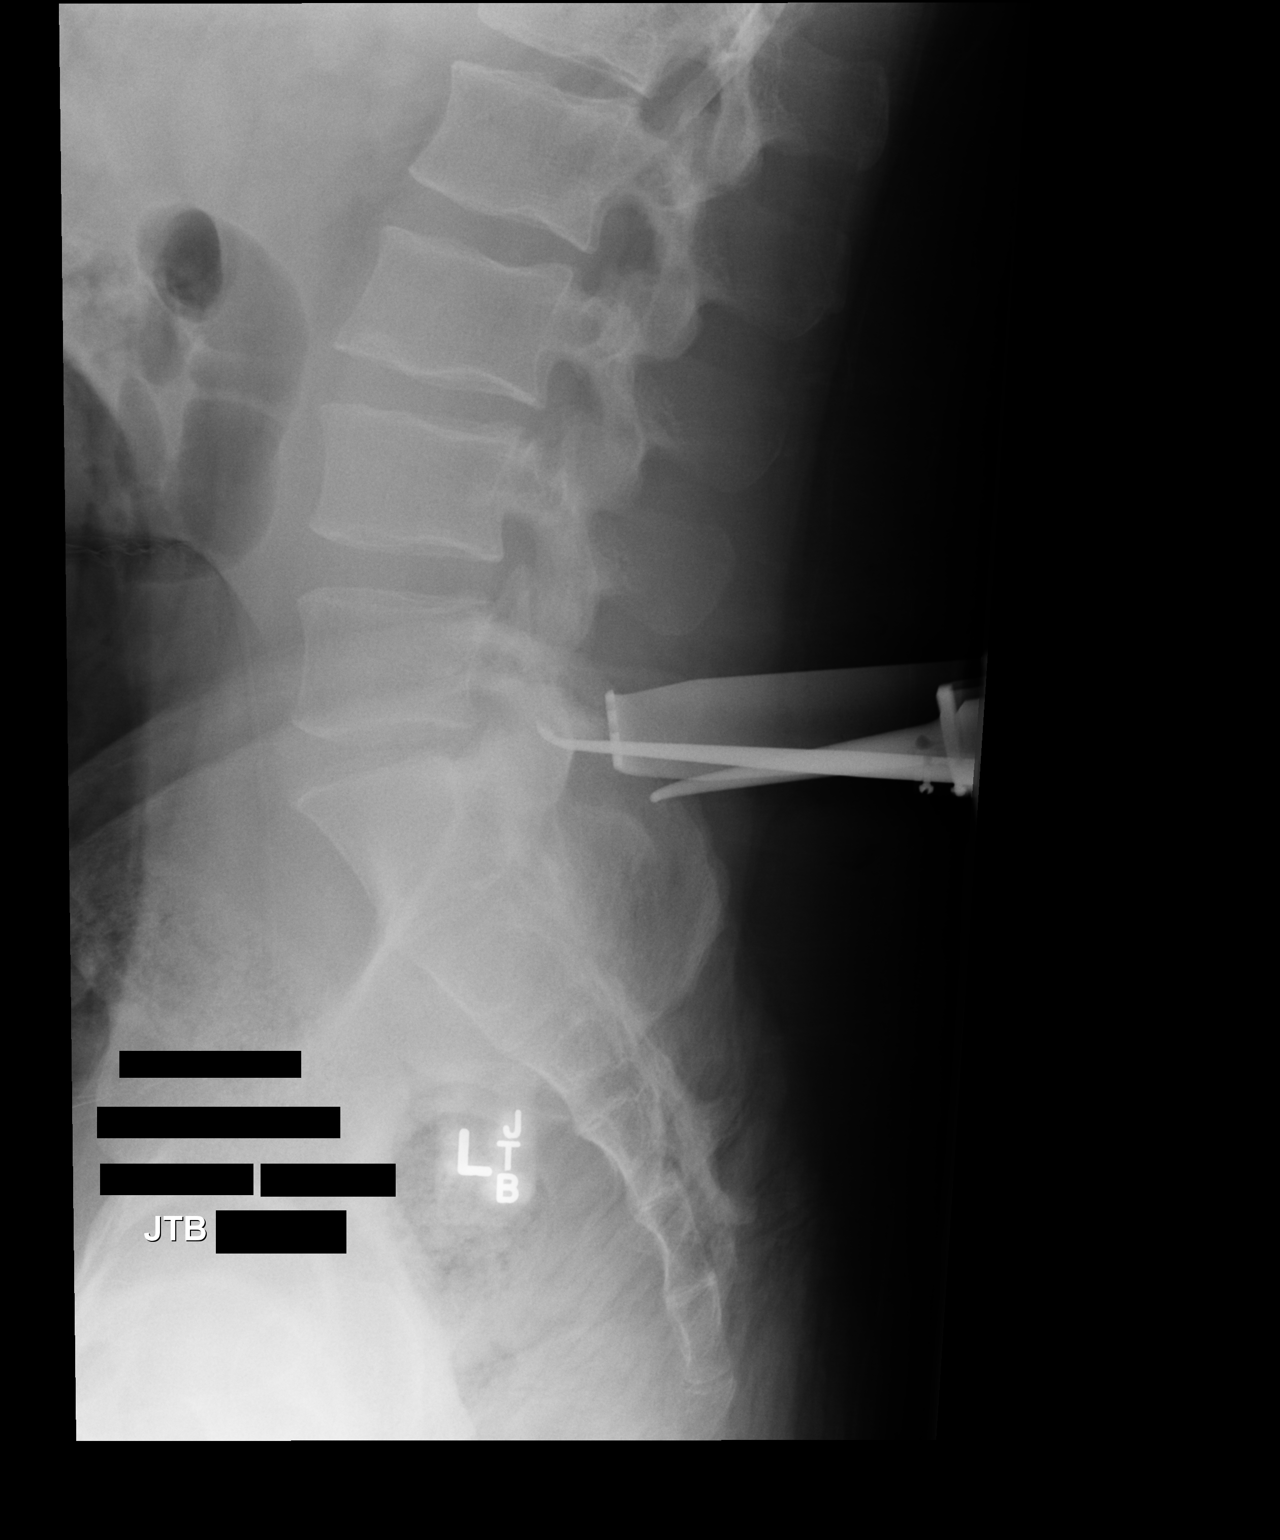

[2 of 2 positions shown; findings below may reference images not displayed]

FINDINGS: Posterior needles are directed at the L4-5 and L5-S1
levels.
IMPRESSION: Intraoperative localization as above.

## 2014-03-20 ENCOUNTER — Other Ambulatory Visit: Payer: Self-pay | Admitting: Internal Medicine

## 2014-03-20 DIAGNOSIS — R109 Unspecified abdominal pain: Secondary | ICD-10-CM

## 2014-03-21 ENCOUNTER — Ambulatory Visit
Admission: RE | Admit: 2014-03-21 | Discharge: 2014-03-21 | Disposition: A | Discharge status: Home / Self Care | Payer: 59 | Source: Ambulatory Visit | Attending: Internal Medicine | Admitting: Internal Medicine

## 2014-03-21 DIAGNOSIS — R109 Unspecified abdominal pain: Secondary | ICD-10-CM

## 2017-01-21 DIAGNOSIS — Z0000 Encounter for general adult medical examination without abnormal findings: Secondary | ICD-10-CM | POA: Diagnosis not present

## 2017-01-21 DIAGNOSIS — R7301 Impaired fasting glucose: Secondary | ICD-10-CM | POA: Diagnosis not present

## 2017-01-21 DIAGNOSIS — I10 Essential (primary) hypertension: Secondary | ICD-10-CM | POA: Diagnosis not present

## 2017-02-02 DIAGNOSIS — E668 Other obesity: Secondary | ICD-10-CM | POA: Diagnosis not present

## 2017-02-02 DIAGNOSIS — Z1389 Encounter for screening for other disorder: Secondary | ICD-10-CM | POA: Diagnosis not present

## 2017-02-02 DIAGNOSIS — R7301 Impaired fasting glucose: Secondary | ICD-10-CM | POA: Diagnosis not present

## 2017-02-02 DIAGNOSIS — E784 Other hyperlipidemia: Secondary | ICD-10-CM | POA: Diagnosis not present

## 2017-02-02 DIAGNOSIS — R740 Nonspecific elevation of levels of transaminase and lactic acid dehydrogenase [LDH]: Secondary | ICD-10-CM | POA: Diagnosis not present

## 2017-02-02 DIAGNOSIS — Z0000 Encounter for general adult medical examination without abnormal findings: Secondary | ICD-10-CM | POA: Diagnosis not present

## 2017-08-01 DIAGNOSIS — B348 Other viral infections of unspecified site: Secondary | ICD-10-CM | POA: Diagnosis not present

## 2018-01-27 DIAGNOSIS — I10 Essential (primary) hypertension: Secondary | ICD-10-CM | POA: Diagnosis not present

## 2018-01-27 DIAGNOSIS — Z0000 Encounter for general adult medical examination without abnormal findings: Secondary | ICD-10-CM | POA: Diagnosis not present

## 2018-01-27 DIAGNOSIS — R82998 Other abnormal findings in urine: Secondary | ICD-10-CM | POA: Diagnosis not present

## 2018-01-27 DIAGNOSIS — R7301 Impaired fasting glucose: Secondary | ICD-10-CM | POA: Diagnosis not present

## 2018-02-07 DIAGNOSIS — I10 Essential (primary) hypertension: Secondary | ICD-10-CM | POA: Diagnosis not present

## 2018-02-07 DIAGNOSIS — Z1389 Encounter for screening for other disorder: Secondary | ICD-10-CM | POA: Diagnosis not present

## 2018-02-07 DIAGNOSIS — Z0000 Encounter for general adult medical examination without abnormal findings: Secondary | ICD-10-CM | POA: Diagnosis not present

## 2018-02-07 DIAGNOSIS — R7301 Impaired fasting glucose: Secondary | ICD-10-CM | POA: Diagnosis not present

## 2018-02-07 DIAGNOSIS — E7849 Other hyperlipidemia: Secondary | ICD-10-CM | POA: Diagnosis not present

## 2018-05-04 DIAGNOSIS — H6121 Impacted cerumen, right ear: Secondary | ICD-10-CM | POA: Diagnosis not present

## 2018-05-04 DIAGNOSIS — H6501 Acute serous otitis media, right ear: Secondary | ICD-10-CM | POA: Diagnosis not present

## 2018-11-19 ENCOUNTER — Ambulatory Visit (INDEPENDENT_AMBULATORY_CARE_PROVIDER_SITE_OTHER): Payer: Self-pay | Admitting: Nurse Practitioner

## 2018-11-19 VITALS — BP 120/70 | HR 88 | Temp 98.4°F | Resp 16 | Wt 269.4 lb

## 2018-11-19 DIAGNOSIS — J019 Acute sinusitis, unspecified: Secondary | ICD-10-CM

## 2018-11-19 DIAGNOSIS — B9789 Other viral agents as the cause of diseases classified elsewhere: Secondary | ICD-10-CM

## 2018-11-19 MED ORDER — PROMETHAZINE-DM 6.25-15 MG/5ML PO SYRP: 5.0000 mL | ORAL_SOLUTION | Freq: Four times a day (QID) | ORAL | 0 refills | Status: AC | PRN

## 2018-11-19 MED ORDER — BENZONATATE 200 MG PO CAPS: 200.0000 mg | ORAL_CAPSULE | Freq: Three times a day (TID) | ORAL | 0 refills | Status: AC | PRN

## 2018-11-19 MED ORDER — FLUTICASONE PROPIONATE 50 MCG/ACT NA SUSP
2.0000 | Freq: Every day | NASAL | 0 refills | Status: AC
Start: 2018-11-19 — End: 2018-11-29

## 2018-11-19 MED FILL — BENZONATATE 200 MG CAPS: 200 | 10 days supply | Qty: 30 | Fill #0

## 2018-11-19 MED FILL — PROMETHAZINE W/DM SYRUP: 6.25-15 | 7 days supply | Qty: 140 | Fill #0

## 2018-11-19 NOTE — Patient Instructions (Addendum)
Sinusitis, Adult -Take medication as prescribed. -Continue taking your Zyrtec. -Ibuprofen or Tylenol for pain, fever, or general discomfort. -Use your albuterol inhaler as needed. -Increase fluids. -Get plenty of rest. -Sleep elevated on at least 2 pillows at bedtime to help with cough. -Use a humidifier or vaporizer when at home and during sleep. -May use a teaspoon of honey or over-the-counter cough drops to help with cough. -May use normal saline nasal spray to help with nasal congestion throughout the day. -Follow-up if symptoms do not improve within the next 7-10 days or sooner if symptoms worsen.   Sinusitis is inflammation of your sinuses. Sinuses are hollow spaces in the bones around your face. Your sinuses are located:  Around your eyes.  In the middle of your forehead.  Behind your nose.  In your cheekbones. Mucus normally drains out of your sinuses. When your nasal tissues become inflamed or swollen, mucus can become trapped or blocked. This allows bacteria, viruses, and fungi to grow, which leads to infection. Most infections of the sinuses are caused by a virus. Sinusitis can develop quickly. It can last for up to 4 weeks (acute) or for more than 12 weeks (chronic). Sinusitis often develops after a cold. What are the causes? This condition is caused by anything that creates swelling in the sinuses or stops mucus from draining. This includes:  Allergies.  Asthma.  Infection from bacteria or viruses.  Deformities or blockages in your nose or sinuses.  Abnormal growths in the nose (nasal polyps).  Pollutants, such as chemicals or irritants in the air.  Infection from fungi (rare). What increases the risk? You are more likely to develop this condition if you:  Have a weak body defense system (immune system).  Do a lot of swimming or diving.  Overuse nasal sprays.  Smoke. What are the signs or symptoms? The main symptoms of this condition are pain and a  feeling of pressure around the affected sinuses. Other symptoms include:  Stuffy nose or congestion.  Thick drainage from your nose.  Swelling and warmth over the affected sinuses.  Headache.  Upper toothache.  A cough that may get worse at night.  Extra mucus that collects in the throat or the back of the nose (postnasal drip).  Decreased sense of smell and taste.  Fatigue.  A fever.  Sore throat.  Bad breath. How is this diagnosed? This condition is diagnosed based on:  Your symptoms.  Your medical history.  A physical exam.  Tests to find out if your condition is acute or chronic. This may include: ? Checking your nose for nasal polyps. ? Viewing your sinuses using a device that has a light (endoscope). ? Testing for allergies or bacteria. ? Imaging tests, such as an MRI or CT scan. In rare cases, a bone biopsy may be done to rule out more serious types of fungal sinus disease. How is this treated? Treatment for sinusitis depends on the cause and whether your condition is chronic or acute.  If caused by a virus, your symptoms should go away on their own within 10 days. You may be given medicines to relieve symptoms. They include: ? Medicines that shrink swollen nasal passages (topical intranasal decongestants). ? Medicines that treat allergies (antihistamines). ? A spray that eases inflammation of the nostrils (topical intranasal corticosteroids). ? Rinses that help get rid of thick mucus in your nose (nasal saline washes).  If caused by bacteria, your health care provider may recommend waiting to see if your  symptoms improve. Most bacterial infections will get better without antibiotic medicine. You may be given antibiotics if you have: ? A severe infection. ? A weak immune system.  If caused by narrow nasal passages or nasal polyps, you may need to have surgery. Follow these instructions at home: Medicines  Take, use, or apply over-the-counter and  prescription medicines only as told by your health care provider. These may include nasal sprays.  If you were prescribed an antibiotic medicine, take it as told by your health care provider. Do not stop taking the antibiotic even if you start to feel better. Hydrate and humidify   Drink enough fluid to keep your urine pale yellow. Staying hydrated will help to thin your mucus.  Use a cool mist humidifier to keep the humidity level in your home above 50%.  Inhale steam for 10-15 minutes, 3-4 times a day, or as told by your health care provider. You can do this in the bathroom while a hot shower is running.  Limit your exposure to cool or dry air. Rest  Rest as much as possible.  Sleep with your head raised (elevated).  Make sure you get enough sleep each night. General instructions   Apply a warm, moist washcloth to your face 3-4 times a day or as told by your health care provider. This will help with discomfort.  Wash your hands often with soap and water to reduce your exposure to germs. If soap and water are not available, use hand sanitizer.  Do not smoke. Avoid being around people who are smoking (secondhand smoke).  Keep all follow-up visits as told by your health care provider. This is important. Contact a health care provider if:  You have a fever.  Your symptoms get worse.  Your symptoms do not improve within 10 days. Get help right away if:  You have a severe headache.  You have persistent vomiting.  You have severe pain or swelling around your face or eyes.  You have vision problems.  You develop confusion.  Your neck is stiff.  You have trouble breathing. Summary  Sinusitis is soreness and inflammation of your sinuses. Sinuses are hollow spaces in the bones around your face.  This condition is caused by nasal tissues that become inflamed or swollen. The swelling traps or blocks the flow of mucus. This allows bacteria, viruses, and fungi to grow,  which leads to infection.  If you were prescribed an antibiotic medicine, take it as told by your health care provider. Do not stop taking the antibiotic even if you start to feel better.  Keep all follow-up visits as told by your health care provider. This is important. This information is not intended to replace advice given to you by your health care provider. Make sure you discuss any questions you have with your health care provider. Document Released: 08/24/2005 Document Revised: 01/24/2018 Document Reviewed: 01/24/2018 Elsevier Interactive Patient Education  2019 [employer].

## 2018-11-19 NOTE — Progress Notes (Signed)
MRN: [id number] DOB: [birth year]  Subjective:   Sean Olson is a 38 y.o. male presenting for chief complaint of sore throat, congestion w/green mucus, left ear pain (x5 days (dayquil and mucinex)) .  Reports 5 day  history of subjective fever, sinus congestion , ear fullness, sore throat, dry cough, wheezing and shortness of breath, fatigue. Has tried Dayquil and Mucinex for relief. Informs throat pain has improved.  Rates current pain 1/10 at present. Denies sinus headache, ear drainage, difficulty swallowing, pain with swallowing, inability to swallow, voice change, productive cough and chest pain, malaise, decreased appetite, nausea, vomiting, abdominal pain and diarrhea. Has had sick contact with his wife who has been ill. Admits to a  history of seasonal allergies.  Patient takes Zyrtec daily for his allergies.  Denies history of asthma. Patient has had a flu shot this season. Denies smoking, alcohol. Denies any other aggravating or relieving factors, no other questions or concerns.  The patient informs he has used his inhaler over the past week, but has not used it within the past 24 hours.  Review of Systems  Constitutional: Positive for fever (subjective) and malaise/fatigue.  HENT: Positive for congestion and sore throat.        Left ear pressure/fullness  Eyes: Negative.   Respiratory: Positive for cough, shortness of breath and wheezing. Negative for hemoptysis and sputum production.   Cardiovascular: Negative.   Gastrointestinal: Negative.   Skin: Negative.   Neurological: Negative.   Endo/Heme/Allergies: Positive for environmental allergies.    Tyerell has a current medication list which includes the following prescription(s): amlodipine, b complex-c, hydrochlorothiazide, multivitamins ther. w/minerals, omeprazole, pravastatin, olmesartan, and probiotic product. Also has No Known Allergies.  Naasir  has a past medical history of Asthma, Hypertension, and Ulcer. Also  has a  past surgical history that includes Wisdom tooth extraction and Lumbar laminectomy/decompression microdiscectomy (Left, 11/02/2012).   Objective:   Vitals: BP 120/70   Pulse 88   Temp 98.4 F (36.9 C)   Resp 16   Wt 269 lb 6.4 oz (122.2 kg)   SpO2 96%   BMI 38.65 kg/m   Physical Exam Vitals signs reviewed.  Constitutional:      General: He is not in acute distress. HENT:     Head: Normocephalic.     Right Ear: Ear canal and external ear normal. A middle ear effusion is present.     Left Ear: Ear canal and external ear normal. A middle ear effusion is present.     Nose: Mucosal edema, congestion (moderate) and rhinorrhea (clear nasal drainage) present.     Right Turbinates: Enlarged and swollen.     Left Turbinates: Enlarged and swollen.     Right Sinus: Maxillary sinus tenderness present. No frontal sinus tenderness.     Left Sinus: Maxillary sinus tenderness present. No frontal sinus tenderness.     Mouth/Throat:     Lips: Pink.     Mouth: Mucous membranes are moist.     Pharynx: Uvula midline. Posterior oropharyngeal erythema present. No pharyngeal swelling, oropharyngeal exudate or uvula swelling.     Tonsils: No tonsillar exudate. Swelling: 0 on the right. 0 on the left.  Eyes:     Conjunctiva/sclera: Conjunctivae normal.     Pupils: Pupils are equal, round, and reactive to light.  Neck:     Musculoskeletal: Normal range of motion and neck supple.  Cardiovascular:     Rate and Rhythm: Normal rate and regular rhythm.     Pulses: Normal  pulses.     Heart sounds: Normal heart sounds.  Pulmonary:     Effort: Pulmonary effort is normal. No respiratory distress.     Breath sounds: Normal breath sounds. No stridor. No wheezing, rhonchi or rales.  Abdominal:     General: Bowel sounds are normal.     Palpations: Abdomen is soft.     Tenderness: There is no abdominal tenderness.  Lymphadenopathy:     Cervical: No cervical adenopathy.  Skin:    General: Skin is warm and  dry.     Capillary Refill: Capillary refill takes less than 2 seconds.  Neurological:     General: No focal deficit present.     Mental Status: He is alert and oriented to person, place, and time.     Cranial Nerves: No cranial nerve deficit.  Psychiatric:        Mood and Affect: Mood normal.        Behavior: Behavior normal.     Assessment and Plan :   Exam findings, diagnosis etiology and medication use and indications reviewed with patient. Follow- Up and discharge instructions provided. No emergent/urgent issues found on exam. Based on the patient's clinical presentation, symptoms, duration of symptoms and physical exam, patient's findings are consistent with that of viral etiology.  Patient has subjective fever that has resolved, purulent nasal drainage or signs of double-sickening at this time.  Patient does exhibit nasal congestion and sinus tenderness.  I would like to attempt symptomatic treatment to include Fluticasone, promethazine-DM and benzonatate.  Patient has zyrtec at home that I would also like him to start. I feel the patient's cough is being aggravated by her postnasal drip, which the Flonase should help.  If patient does not have improvement in her symptoms within 7-10 days, an antibiotic would be appropriate at that time. Patient is well-appearing, is in no acute distress and vital signs are stable. Patient education was provided. Patient verbalized understanding of information provided and agrees with plan of care (POC), all questions answered. The patient is advised to call or return to clinic if conditiondoes not see an improvement in symptoms, or to seek the care of the closest emergency department if conditionworsens with the above plan.    1. Acute viral sinusitis  - fluticasone (FLONASE) 50 MCG/ACT nasal spray; Place 2 sprays into both nostrils daily for 10 days.  Dispense: 16 g; Refill: 0 - benzonatate (TESSALON) 200 MG capsule; Take 1 capsule (200 mg total) by  mouth 3 (three) times daily as needed for up to 10 days for cough.  Dispense: 30 capsule; Refill: 0 - promethazine-dextromethorphan (PROMETHAZINE-DM) 6.25-15 MG/5ML syrup; Take 5 mLs by mouth 4 (four) times daily as needed for up to 7 days.  Dispense: 140 mL; Refill: 0 -Take medication as prescribed. -Continue taking your Zyrtec. -Ibuprofen or Tylenol for pain, fever, or general discomfort. -Use your albuterol inhaler as needed. -Increase fluids. -Get plenty of rest. -Sleep elevated on at least 2 pillows at bedtime to help with cough. -Use a humidifier or vaporizer when at home and during sleep. -May use a teaspoon of honey or over-the-counter cough drops to help with cough. -May use normal saline nasal spray to help with nasal congestion throughout the day. -Follow-up if symptoms do not improve within the next 7-10 days or sooner if symptoms worsen.

## 2018-11-21 ENCOUNTER — Telehealth: Payer: Self-pay

## 2018-11-21 NOTE — Telephone Encounter (Signed)
Patient did not answered the phone 

## 2019-07-17 ENCOUNTER — Other Ambulatory Visit: Payer: Self-pay

## 2019-07-17 DIAGNOSIS — Z20822 Contact with and (suspected) exposure to covid-19: Secondary | ICD-10-CM

## 2019-07-19 LAB — NOVEL CORONAVIRUS, NAA: SARS-CoV-2, NAA: NOT DETECTED

## 2019-11-24 ENCOUNTER — Ambulatory Visit: Payer: Self-pay | Attending: Internal Medicine

## 2019-11-24 DIAGNOSIS — Z23 Encounter for immunization: Secondary | ICD-10-CM

## 2019-11-24 NOTE — Progress Notes (Signed)
   Covid-19 Vaccination Clinic  Name:  KEINAN BROUILLET    MRN: [id number] DOB: 12/18/1980  11/24/2019  Mr. Maye was observed post Covid-19 immunization for 15 minutes without incident. He was provided with Vaccine Information Sheet and instruction to access the V-Safe system.   Mr. Dubray was instructed to call 911 with any severe reactions post vaccine: [name] Kitchen Difficulty breathing  . Swelling of face and throat  . A fast heartbeat  . A bad rash all over body  . Dizziness and weakness   Immunizations Administered    Name Date Dose VIS Date Route   Pfizer COVID-19 Vaccine 11/24/2019  3:26 PM 0.3 mL 08/18/2019 Intramuscular   Manufacturer: [employer], [employer]   Lot: [id number]   NDC: [address]-6

## 2019-12-20 ENCOUNTER — Ambulatory Visit: Payer: Self-pay | Attending: Internal Medicine

## 2019-12-20 DIAGNOSIS — Z23 Encounter for immunization: Secondary | ICD-10-CM

## 2019-12-20 NOTE — Progress Notes (Signed)
   Covid-19 Vaccination Clinic  Name:  Sean Olson    MRN: [id number] DOB: 15-Apr-1981  12/20/2019  Mr. [name] was observed post Covid-19 immunization for 15 minutes without incident. He was provided with Vaccine Information Sheet and instruction to access the V-Safe system.   Mr. [name] was instructed to call 911 with any severe reactions post vaccine: [name] Kitchen Difficulty breathing  . Swelling of face and throat  . A fast heartbeat  . A bad rash all over body  . Dizziness and weakness   Immunizations Administered    Name Date Dose VIS Date Route   Pfizer COVID-19 Vaccine 12/20/2019  3:13 PM 0.3 mL 08/18/2019 Intramuscular   Manufacturer: [employer], [employer]   Lot: [id number]   NDC: [address]-8

## 2020-08-10 ENCOUNTER — Ambulatory Visit: Payer: Self-pay | Attending: Internal Medicine

## 2020-08-10 DIAGNOSIS — Z23 Encounter for immunization: Secondary | ICD-10-CM

## 2020-08-10 NOTE — Progress Notes (Signed)
   Covid-19 Vaccination Clinic  Name:  Sean Olson    MRN: [id number] DOB: March 17, 1981  08/10/2020  Mr. Nephew was observed post Covid-19 immunization for 15 minutes without incident. He was provided with Vaccine Information Sheet and instruction to access the V-Safe system.   [name] was instructed to call 911 with any severe reactions post vaccine: [name] Kitchen Difficulty breathing  . Swelling of face and throat  . A fast heartbeat  . A bad rash all over body  . Dizziness and weakness   Immunizations Administered    Name Date Dose VIS Date Route   Pfizer COVID-19 Vaccine 08/10/2020 11:37 AM 0.3 mL 06/26/2020 Intramuscular   Manufacturer: [employer], [employer]   Lot: [id number]   NDC: [address]-6

## 2022-03-02 ENCOUNTER — Other Ambulatory Visit (HOSPITAL_COMMUNITY): Payer: Self-pay

## 2022-03-09 ENCOUNTER — Other Ambulatory Visit: Payer: Self-pay

## 2022-03-09 ENCOUNTER — Other Ambulatory Visit (HOSPITAL_COMMUNITY): Payer: Self-pay

## 2022-03-09 MED ORDER — OMEPRAZOLE 20 MG PO CPDR
DELAYED_RELEASE_CAPSULE | ORAL | 4 refills | Status: DC
  Filled 2022-03-09: qty 90, 90d supply, fill #0
  Filled 2022-03-09: qty 30, 30d supply, fill #0
  Filled 2022-06-11 – 2022-06-16 (×2): qty 90, 90d supply, fill #1
  Filled 2022-10-08: qty 90, 90d supply, fill #2
  Filled 2023-01-12: qty 90, 90d supply, fill #3

## 2022-03-09 MED ORDER — AMLODIPINE-OLMESARTAN 5-40 MG PO TABS
ORAL_TABLET | ORAL | 4 refills | Status: DC
  Filled 2022-03-09: qty 30, 30d supply, fill #0
  Filled 2022-03-09 (×2): qty 90, 90d supply, fill #0
  Filled 2022-03-11: qty 60, 60d supply, fill #0
  Filled 2022-03-12: qty 30, 30d supply, fill #0
  Filled 2022-06-11 – 2022-06-17 (×4): qty 90, 90d supply, fill #1
  Filled 2022-10-08: qty 90, 90d supply, fill #2
  Filled 2023-01-12: qty 30, 30d supply, fill #3
  Filled 2023-01-13: qty 60, 60d supply, fill #3

## 2022-03-09 MED ORDER — HYDROCHLOROTHIAZIDE 25 MG PO TABS
ORAL_TABLET | ORAL | 4 refills | Status: DC
  Filled 2022-03-09: qty 90, 90d supply, fill #0
  Filled 2022-03-09: qty 30, 30d supply, fill #0
  Filled 2022-06-11 – 2022-06-16 (×2): qty 90, 90d supply, fill #1
  Filled 2022-10-08: qty 90, 90d supply, fill #2
  Filled 2023-01-12: qty 90, 90d supply, fill #3

## 2022-03-09 MED ORDER — ROSUVASTATIN CALCIUM 20 MG PO TABS
ORAL_TABLET | ORAL | 4 refills | Status: DC
  Filled 2022-03-09: qty 90, 90d supply, fill #0
  Filled 2022-03-09: qty 30, 30d supply, fill #0
  Filled 2022-03-09: qty 60, 60d supply, fill #0
  Filled 2022-06-11 – 2022-06-16 (×2): qty 90, 90d supply, fill #1
  Filled 2022-10-08: qty 90, 90d supply, fill #2
  Filled 2023-01-12: qty 90, 90d supply, fill #3

## 2022-03-11 ENCOUNTER — Other Ambulatory Visit (HOSPITAL_COMMUNITY): Payer: Self-pay

## 2022-03-12 ENCOUNTER — Other Ambulatory Visit (HOSPITAL_COMMUNITY): Payer: Self-pay

## 2022-03-14 ENCOUNTER — Other Ambulatory Visit (HOSPITAL_COMMUNITY): Payer: Self-pay

## 2022-03-23 ENCOUNTER — Other Ambulatory Visit (HOSPITAL_COMMUNITY): Payer: Self-pay

## 2022-03-23 MED ORDER — METHOCARBAMOL 500 MG PO TABS
ORAL_TABLET | ORAL | 0 refills | Status: AC
  Filled 2022-07-08: qty 60, 30d supply, fill #0

## 2022-03-23 MED ORDER — METHOCARBAMOL 500 MG PO TABS
ORAL_TABLET | ORAL | 0 refills | Status: AC
  Filled 2022-03-23: qty 60, 30d supply, fill #0

## 2022-03-23 MED ORDER — MELOXICAM 15 MG PO TABS
ORAL_TABLET | ORAL | 1 refills | Status: AC
  Filled 2022-03-23: qty 30, 30d supply, fill #0
  Filled 2022-07-08: qty 30, 30d supply, fill #1

## 2022-05-08 ENCOUNTER — Other Ambulatory Visit (HOSPITAL_COMMUNITY): Payer: Self-pay

## 2022-05-08 MED ORDER — LAGEVRIO 200 MG PO CAPS
ORAL_CAPSULE | ORAL | 0 refills | Status: AC
  Filled 2022-05-08: qty 40, 5d supply, fill #0

## 2022-05-09 ENCOUNTER — Other Ambulatory Visit (HOSPITAL_COMMUNITY): Payer: Self-pay

## 2022-05-19 ENCOUNTER — Other Ambulatory Visit (HOSPITAL_COMMUNITY): Payer: Self-pay

## 2022-05-19 MED ORDER — DOXYCYCLINE HYCLATE 100 MG PO CAPS
100.0000 mg | ORAL_CAPSULE | Freq: Two times a day (BID) | ORAL | 0 refills | Status: DC
Start: 2022-05-19 — End: 2022-12-20
  Filled 2022-05-19: qty 20, 10d supply, fill #0

## 2022-05-20 ENCOUNTER — Other Ambulatory Visit (HOSPITAL_COMMUNITY): Payer: Self-pay

## 2022-06-11 ENCOUNTER — Other Ambulatory Visit (HOSPITAL_COMMUNITY): Payer: Self-pay

## 2022-06-12 ENCOUNTER — Other Ambulatory Visit (HOSPITAL_COMMUNITY): Payer: Self-pay

## 2022-06-15 ENCOUNTER — Other Ambulatory Visit (HOSPITAL_COMMUNITY): Payer: Self-pay

## 2022-06-16 ENCOUNTER — Other Ambulatory Visit (HOSPITAL_COMMUNITY): Payer: Self-pay

## 2022-06-17 ENCOUNTER — Other Ambulatory Visit (HOSPITAL_COMMUNITY): Payer: Self-pay

## 2022-07-08 ENCOUNTER — Other Ambulatory Visit (HOSPITAL_COMMUNITY): Payer: Self-pay

## 2022-08-13 ENCOUNTER — Other Ambulatory Visit (HOSPITAL_COMMUNITY): Payer: Self-pay

## 2022-08-13 MED ORDER — BENZONATATE 200 MG PO CAPS
200.0000 mg | ORAL_CAPSULE | Freq: Three times a day (TID) | ORAL | 0 refills | Status: AC
Start: 2022-08-13 — End: ?
  Filled 2022-08-13: qty 45, 15d supply, fill #0

## 2022-08-13 MED ORDER — CEFDINIR 300 MG PO CAPS
300.0000 mg | ORAL_CAPSULE | Freq: Two times a day (BID) | ORAL | 0 refills | Status: AC
  Filled 2022-08-13: qty 20, 10d supply, fill #0

## 2022-08-17 ENCOUNTER — Other Ambulatory Visit (HOSPITAL_COMMUNITY): Payer: Self-pay

## 2022-08-17 MED ORDER — ALBUTEROL SULFATE HFA 108 (90 BASE) MCG/ACT IN AERS
1.0000 | INHALATION_SPRAY | RESPIRATORY_TRACT | 0 refills | Status: DC
  Filled 2022-08-17: qty 6.7, 31d supply, fill #0

## 2022-08-17 MED ORDER — PROMETHAZINE-CODEINE 6.25-10 MG/5ML PO SYRP
ORAL_SOLUTION | ORAL | 0 refills | Status: AC
  Filled 2022-08-17: qty 100, 5d supply, fill #0

## 2022-08-17 MED ORDER — PREDNISONE 10 MG PO TABS
ORAL_TABLET | ORAL | 0 refills | Status: AC
  Filled 2022-08-17: qty 21, 6d supply, fill #0

## 2022-08-18 ENCOUNTER — Other Ambulatory Visit (HOSPITAL_COMMUNITY): Payer: Self-pay

## 2022-08-21 ENCOUNTER — Other Ambulatory Visit (HOSPITAL_COMMUNITY): Payer: Self-pay

## 2022-08-21 MED ORDER — OSELTAMIVIR PHOSPHATE 75 MG PO CAPS
ORAL_CAPSULE | ORAL | 0 refills | Status: AC
  Filled 2022-08-21: qty 10, 10d supply, fill #0

## 2022-10-08 ENCOUNTER — Other Ambulatory Visit (HOSPITAL_COMMUNITY): Payer: Self-pay

## 2022-10-09 ENCOUNTER — Other Ambulatory Visit (HOSPITAL_COMMUNITY): Payer: Self-pay

## 2022-12-20 ENCOUNTER — Ambulatory Visit
Admission: RE | Admit: 2022-12-20 | Discharge: 2022-12-20 | Disposition: A | Discharge status: Home / Self Care | Payer: No Typology Code available for payment source | Source: Ambulatory Visit | Attending: Physician Assistant | Admitting: Physician Assistant

## 2022-12-20 VITALS — BP 137/83 | HR 110 | Temp 99.5°F | Resp 18

## 2022-12-20 DIAGNOSIS — J209 Acute bronchitis, unspecified: Secondary | ICD-10-CM | POA: Diagnosis not present

## 2022-12-20 MED ORDER — DOXYCYCLINE HYCLATE 100 MG PO CAPS: 100.0000 mg | ORAL_CAPSULE | Freq: Two times a day (BID) | ORAL | 0 refills | Status: AC

## 2022-12-20 MED ORDER — PREDNISONE 50 MG PO TABS: ORAL_TABLET | ORAL | 0 refills | Status: AC

## 2022-12-20 MED ORDER — IPRATROPIUM-ALBUTEROL 0.5-2.5 (3) MG/3ML IN SOLN
3.0000 mL | Freq: Once | RESPIRATORY_TRACT | Status: AC
  Administered 2022-12-20: 3 mL via RESPIRATORY_TRACT

## 2022-12-20 MED ORDER — DEXAMETHASONE SODIUM PHOSPHATE 10 MG/ML IJ SOLN
10.0000 mg | Freq: Once | INTRAMUSCULAR | Status: AC
  Administered 2022-12-20: 10 mg via INTRAMUSCULAR

## 2022-12-20 MED ORDER — ALBUTEROL SULFATE HFA 108 (90 BASE) MCG/ACT IN AERS: 2.0000 | INHALATION_SPRAY | Freq: Four times a day (QID) | RESPIRATORY_TRACT | 2 refills | Status: AC | PRN

## 2022-12-20 NOTE — ED Triage Notes (Signed)
Patient presents with chest congestion that started a week ago.

## 2022-12-20 NOTE — Discharge Instructions (Addendum)
Return if any problems.

## 2022-12-21 ENCOUNTER — Other Ambulatory Visit (HOSPITAL_COMMUNITY): Payer: Self-pay

## 2022-12-21 MED ORDER — BENZONATATE 100 MG PO CAPS
100.0000 mg | ORAL_CAPSULE | Freq: Three times a day (TID) | ORAL | 1 refills | Status: AC | PRN
  Filled 2022-12-21: qty 90, 30d supply, fill #0

## 2022-12-22 NOTE — ED Provider Notes (Signed)
[facility]    CSN: [id number] Arrival date & time: 12/20/22  0905      History   Chief Complaint Chief Complaint  Patient presents with   Fever    Cough, head congestion, burning cough, headache, - Entered by patient   Chest congestion    HPI Sean Olson is a 42 y.o. male.   Patient complains of a cough and congestion that started a week ago.  Patient complains of feeling short of breath he has a history of asthma.  The history is provided by the patient. No language interpreter was used.  Fever Severity:  Moderate Onset quality:  Gradual Timing:  Constant Progression:  Worsening Chronicity:  New Associated symptoms: vomiting     Past Medical History:  Diagnosis Date   Asthma    Hypertension    Ulcer     There are no problems to display for this patient.   Past Surgical History:  Procedure Laterality Date   LUMBAR LAMINECTOMY/DECOMPRESSION MICRODISCECTOMY Left 11/02/2012   Procedure: LUMBAR LAMINECTOMY/DECOMPRESSION MICRODISCECTOMY;  Surgeon: Emilee Hero, MD;  Location: [facility] OR;  Service: Orthopedics;  Laterality: Left;  Left sided lumbar 5-sacrum 1 microdisectomy   WISDOM TOOTH EXTRACTION         Home Medications    Prior to Admission medications   Medication Sig Start Date End Date Taking? Authorizing Provider  albuterol (VENTOLIN HFA) 108 (90 Base) MCG/ACT inhaler Inhale 2 puffs into the lungs every 6 (six) hours as needed for wheezing or shortness of breath. 12/20/22  Yes Cheron Schaumann K, PA-C  doxycycline (VIBRAMYCIN) 100 MG capsule Take 1 capsule (100 mg total) by mouth 2 (two) times daily. 12/20/22  Yes Cheron Schaumann K, PA-C  predniSONE (DELTASONE) 50 MG tablet One tablet a day for 5 days 12/20/22  Yes Elson Areas, PA-C  amLODipine (NORVASC) 5 MG tablet Take 5 mg by mouth daily.    [provider]  amLODipine-olmesartan (AZOR) 5-40 MG tablet TAKE 1 TABLET BY MOUTH ONCE DAILY 03/09/22     B Complex-C (SUPER B  COMPLEX PO) Take 1 tablet by mouth daily.      [provider]  benzonatate (TESSALON) 100 MG capsule Take 1 capsule (100 mg total) by mouth 3 (three) times daily as needed for cough 12/21/22     benzonatate (TESSALON) 200 MG capsule Take 1 capsule (200 mg total) by mouth 3 (three) times daily as directed. 08/13/22     cefdinir (OMNICEF) 300 MG capsule Take 1 capsule (300 mg total) by mouth 2 (two) times daily for 10 days. 08/13/22     fluticasone (FLONASE) 50 MCG/ACT nasal spray Place 2 sprays into both nostrils daily for 10 days. 11/19/18 11/29/18  Leath-Warren, Sadie Haber, NP  hydrochlorothiazide (HYDRODIURIL) 25 MG tablet Take 25 mg by mouth daily.      [provider]  hydrochlorothiazide (HYDRODIURIL) 25 MG tablet TAKE 1 TABLET BY MOUTH ONCE DAILY 03/09/22     meloxicam (MOBIC) 15 MG tablet Take 1 tablet by mouth once a day with meals 03/23/22     methocarbamol (ROBAXIN) 500 MG tablet Take 1 tablet by mouth twice a day as needed for spasms 03/23/22     methocarbamol (ROBAXIN) 500 MG tablet Take 1 tablet by mouth twice a day as needed for spasms 03/23/22     molnupiravir EUA (LAGEVRIO) 200 MG CAPS capsule Take 4 capsules every 12 hours for 5 days. 05/08/22     Multiple Vitamins-Minerals (MULTIVITAMINS THER. W/MINERALS)  TABS Take 1 tablet by mouth daily.      [provider]  olmesartan (BENICAR) 20 MG tablet Take 20 mg by mouth daily.    [provider]  omeprazole (PRILOSEC) 20 MG capsule Take 20 mg by mouth daily.      [provider]  omeprazole (PRILOSEC) 20 MG capsule TAKE 1 CAPSULE BY MOUTH  ONCE DAILY 03/09/22     oseltamivir (TAMIFLU) 75 MG capsule Take 1 capsule by mouth once a day for 10 days (flu exposure) 08/21/22     pravastatin (PRAVACHOL) 20 MG tablet Take 20 mg by mouth daily.    [provider]  Probiotic Product (PROBIOTIC DAILY PO) Take 1 capsule by mouth daily.    [provider]  promethazine-codeine (PHENERGAN WITH CODEINE)  6.25-10 MG/5ML syrup Take 5 mL every 6 hours as needed 08/17/22     rosuvastatin (CRESTOR) 20 MG tablet TAKE 1 TABLET BY MOUTH ONCE DAILY 03/09/22       Family History History reviewed. No pertinent family history.  Social History Social History   Tobacco Use   Smoking status: Never  Substance Use Topics   Alcohol use: Yes    Comment: Occassional   Drug use: No     Allergies   Patient has no known allergies.   Review of Systems Review of Systems  Constitutional:  Positive for fever.  Gastrointestinal:  Positive for vomiting.  All other systems reviewed and are negative.    Physical Exam Triage Vital Signs ED Triage Vitals  Enc Vitals Group     BP 12/20/22 0920 137/83     Pulse Rate 12/20/22 0920 (!) 110     Resp 12/20/22 0920 18     Temp 12/20/22 0920 99.5 F (37.5 C)     Temp src --      SpO2 12/20/22 0920 90 %     Weight --      Height --      Head Circumference --      Peak Flow --      Pain Score 12/20/22 0918 4     Pain Loc --      Pain Edu? --      Excl. in GC? --    No data found.  Updated Vital Signs BP 137/83   Pulse (!) 110   Temp 99.5 F (37.5 C)   Resp 18   SpO2 94%   Visual Acuity Right Eye Distance:   Left Eye Distance:   Bilateral Distance:    Right Eye Near:   Left Eye Near:    Bilateral Near:     Physical Exam Vitals and nursing note reviewed.  Constitutional:      Appearance: He is well-developed.  HENT:     Head: Normocephalic.  Cardiovascular:     Rate and Rhythm: Normal rate.  Pulmonary:     Effort: Pulmonary effort is normal.  Abdominal:     General: Abdomen is flat. There is no distension.  Musculoskeletal:        General: Normal range of motion.     Cervical back: Normal range of motion.  Skin:    General: Skin is warm.  Neurological:     General: No focal deficit present.     Mental Status: He is alert and oriented to person, place, and time.      UC Treatments / Results  Labs (all labs ordered are  listed, but only abnormal results are displayed) Labs Reviewed -  No data to display  EKG   Radiology No results found.  Procedures Procedures (including critical care time)  Medications Ordered in UC Medications  ipratropium-albuterol (DUONEB) 0.5-2.5 (3) MG/3ML nebulizer solution 3 mL (3 mLs Nebulization Given 12/20/22 0934)  dexamethasone (DECADRON) injection 10 mg (10 mg Intramuscular Given 12/20/22 0934)    Initial Impression / Assessment and Plan / UC Course  I have reviewed the triage vital signs and the nursing notes.  Pertinent labs & imaging results that were available during my care of the patient were reviewed by me and considered in my medical decision making (see chart for details).      Final Clinical Impressions(s) / UC Diagnoses   Final diagnoses:  Acute bronchitis, unspecified organism     Discharge Instructions      Return if any problems.   ED Prescriptions     Medication Sig Dispense Auth. Provider   albuterol (VENTOLIN HFA) 108 (90 Base) MCG/ACT inhaler Inhale 2 puffs into the lungs every 6 (six) hours as needed for wheezing or shortness of breath. 8 g Rosaland Shiffman K, PA-C   predniSONE (DELTASONE) 50 MG tablet One tablet a day for 5 days 5 tablet Briarrose Shor K, [place]   doxycycline (VIBRAMYCIN) 100 MG capsule Take 1 capsule (100 mg total) by mouth 2 (two) times daily. 20 capsule Elson Areas, [place]      PDMP not reviewed this encounter.  An After Visit Summary was printed and given to the patient.       Elson Areas, [place] 12/22/22 1423

## 2023-01-12 ENCOUNTER — Other Ambulatory Visit (HOSPITAL_COMMUNITY): Payer: Self-pay

## 2023-01-13 ENCOUNTER — Ambulatory Visit (INDEPENDENT_AMBULATORY_CARE_PROVIDER_SITE_OTHER): Payer: No Typology Code available for payment source

## 2023-01-13 ENCOUNTER — Ambulatory Visit: Payer: No Typology Code available for payment source | Admitting: Podiatry

## 2023-01-13 ENCOUNTER — Other Ambulatory Visit (HOSPITAL_COMMUNITY): Payer: Self-pay

## 2023-01-13 DIAGNOSIS — M216X2 Other acquired deformities of left foot: Secondary | ICD-10-CM

## 2023-01-13 DIAGNOSIS — Q666 Other congenital valgus deformities of feet: Secondary | ICD-10-CM | POA: Diagnosis not present

## 2023-01-13 DIAGNOSIS — Z01818 Encounter for other preprocedural examination: Secondary | ICD-10-CM

## 2023-01-13 NOTE — Progress Notes (Signed)
Subjective:  Patient ID: Sean Olson, male    DOB: 04-22-1981,  MRN: [id number]  Chief Complaint  Patient presents with   Callouses    42 y.o. male presents with the above complaint.  Patient presents with left fifth metatarsal plantarflexed submet 5 lesion as well as underlying pes planovalgus deformity.  Patient has been present for quite some time is progressive gotten worse.  He is tried shoe gear modification padding protecting debriding the lesion down himself.  None of which has helped.  He would like to discuss surgical options.  Pain scale 7 out of 10 dull achy in nature.  He would also like to get orthotics as well.   Review of Systems: Negative except as noted in the HPI. Denies N/V/F/Ch.  Past Medical History:  Diagnosis Date   Asthma    Hypertension    Ulcer     Current Outpatient Medications:    albuterol (VENTOLIN HFA) 108 (90 Base) MCG/ACT inhaler, Inhale 2 puffs into the lungs every 6 (six) hours as needed for wheezing or shortness of breath., Disp: 8 g, Rfl: 2   amLODipine (NORVASC) 5 MG tablet, Take 5 mg by mouth daily., Disp: , Rfl:    amLODipine-olmesartan (AZOR) 5-40 MG tablet, TAKE 1 TABLET BY MOUTH ONCE DAILY, Disp: 90 tablet, Rfl: 4   B Complex-C (SUPER B COMPLEX PO), Take 1 tablet by mouth daily.  , Disp: , Rfl:    benzonatate (TESSALON) 100 MG capsule, Take 1 capsule (100 mg total) by mouth 3 (three) times daily as needed for cough, Disp: 90 capsule, Rfl: 1   benzonatate (TESSALON) 200 MG capsule, Take 1 capsule (200 mg total) by mouth 3 (three) times daily as directed., Disp: 45 capsule, Rfl: 0   cefdinir (OMNICEF) 300 MG capsule, Take 1 capsule (300 mg total) by mouth 2 (two) times daily for 10 days., Disp: 20 capsule, Rfl: 0   doxycycline (VIBRAMYCIN) 100 MG capsule, Take 1 capsule (100 mg total) by mouth 2 (two) times daily., Disp: 20 capsule, Rfl: 0   fluticasone (FLONASE) 50 MCG/ACT nasal spray, Place 2 sprays into both nostrils daily for 10 days.,  Disp: 16 g, Rfl: 0   hydrochlorothiazide (HYDRODIURIL) 25 MG tablet, Take 25 mg by mouth daily.  , Disp: , Rfl:    hydrochlorothiazide (HYDRODIURIL) 25 MG tablet, TAKE 1 TABLET BY MOUTH ONCE DAILY, Disp: 90 tablet, Rfl: 4   meloxicam (MOBIC) 15 MG tablet, Take 1 tablet by mouth once a day with meals, Disp: 30 tablet, Rfl: 1   methocarbamol (ROBAXIN) 500 MG tablet, Take 1 tablet by mouth twice a day as needed for spasms, Disp: 60 tablet, Rfl: 0   methocarbamol (ROBAXIN) 500 MG tablet, Take 1 tablet by mouth twice a day as needed for spasms, Disp: 60 tablet, Rfl: 0   molnupiravir EUA (LAGEVRIO) 200 MG CAPS capsule, Take 4 capsules every 12 hours for 5 days., Disp: 40 capsule, Rfl: 0   Multiple Vitamins-Minerals (MULTIVITAMINS THER. W/MINERALS) TABS, Take 1 tablet by mouth daily.  , Disp: , Rfl:    olmesartan (BENICAR) 20 MG tablet, Take 20 mg by mouth daily., Disp: , Rfl:    omeprazole (PRILOSEC) 20 MG capsule, Take 20 mg by mouth daily.  , Disp: , Rfl:    omeprazole (PRILOSEC) 20 MG capsule, TAKE 1 CAPSULE BY MOUTH  ONCE DAILY, Disp: 90 capsule, Rfl: 4   oseltamivir (TAMIFLU) 75 MG capsule, Take 1 capsule by mouth once a day for 10 days (  flu exposure), Disp: 10 capsule, Rfl: 0   pravastatin (PRAVACHOL) 20 MG tablet, Take 20 mg by mouth daily., Disp: , Rfl:    predniSONE (DELTASONE) 50 MG tablet, One tablet a day for 5 days, Disp: 5 tablet, Rfl: 0   Probiotic Product (PROBIOTIC DAILY PO), Take 1 capsule by mouth daily., Disp: , Rfl:    promethazine-codeine (PHENERGAN WITH CODEINE) 6.25-10 MG/5ML syrup, Take 5 mL every 6 hours as needed, Disp: 100 mL, Rfl: 0   rosuvastatin (CRESTOR) 20 MG tablet, TAKE 1 TABLET BY MOUTH ONCE DAILY, Disp: 90 tablet, Rfl: 4  Social History   Tobacco Use  Smoking Status Never  Smokeless Tobacco Not on file    No Known Allergies Objective:  There were no vitals filed for this visit. There is no height or weight on file to calculate BMI. Constitutional Well  developed. Well nourished.  Vascular Dorsalis pedis pulses palpable bilaterally. Posterior tibial pulses palpable bilaterally. Capillary refill normal to all digits.  No cyanosis or clubbing noted. Pedal hair growth normal.  Neurologic Normal speech. Oriented to person, place, and time. Epicritic sensation to light touch grossly present bilaterally.  Dermatologic Nails well groomed and normal in appearance. No open wounds. No skin lesions.  Orthopedic: Pain on palpation to left submetatarsal 5 with underlying plantarflexed fifth metatarsal.  Gait examination shows pes planovalgus foot structure with calcaneovalgus to many toe signs partially able to recruit the arch with dorsiflexion of the hallux.   Radiographs: 3 views of skeletally mature adult left foot: Plantarflexed fifth metatarsal noted.  No other bony abnormalities identified. Assessment:   1. Plantar flexed metatarsal, left   2. Pes planovalgus   3. Encounter for preoperative examination for general surgical procedure    Plan:  Patient was evaluated and treated and all questions answered.  Left plantarflexed fifth metatarsal -All questions and concerns were discussed with the patient in extensive detail given the amount of pain that he is experiencing he will benefit from left fifth metatarsal floating osteotomy.  I discussed my preoperative intra postop plan with the patient extensive detail he states understanding like to proceed with surgery -Informed surgical risk consent was reviewed and read aloud to the patient.  I reviewed the films.  I have discussed my findings with the patient in great detail.  I have discussed all risks including but not limited to infection, stiffness, scarring, limp, disability, deformity, damage to blood vessels and nerves, numbness, poor healing, need for braces, arthritis, chronic pain, amputation, death.  All benefits and realistic expectations discussed in great detail.  I have made no promises  as to the outcome.  I have provided realistic expectations.  I have offered the patient a 2nd opinion, which they have declined and assured me they preferred to proceed despite the risks  Pes planovalgus -I explained to patient the etiology of pes planovalgus and relationship with Planter fasciitis/arch pain and various treatment options were discussed.  Given patient foot structure in the setting of Planter fasciitis/arch pain I believe patient will benefit from custom-made orthotics to help control the hindfoot motion support the arch of the foot and take the stress away from plantar fascial.  Patient agrees with the plan like to proceed with orthotics -Patient was casted for orthotics   No follow-ups on file.

## 2023-01-26 NOTE — Addendum Note (Signed)
Addended by: Nicholes Rough on: 01/26/2023 08:37 AM   Modules accepted: Orders

## 2023-02-09 ENCOUNTER — Telehealth: Payer: Self-pay | Admitting: Urology

## 2023-02-09 NOTE — Telephone Encounter (Addendum)
DOS - 03/08/23  METATARSAL OSTEOTOMY 5TH LEFT --- [address]  [employer] EFFECTIVE DATE - 09/07/22  DEDUCTIBLE - $1,000.00 OOP - $3,500.00 COINSURANCE - 25%    SPOKE WITH ALEX C. WITH [facility] AND SHE STATED THAT FOR CPT CODE 60454 HAS BEEN APPROVED, AUTH # [id number], GOOD FROM 02/09/23 - 08/08/23.

## 2023-03-08 ENCOUNTER — Other Ambulatory Visit: Payer: Self-pay | Admitting: Podiatry

## 2023-03-08 ENCOUNTER — Encounter: Payer: Self-pay | Admitting: Podiatry

## 2023-03-08 DIAGNOSIS — M21542 Acquired clubfoot, left foot: Secondary | ICD-10-CM | POA: Diagnosis not present

## 2023-03-08 MED ORDER — OXYCODONE-ACETAMINOPHEN 5-325 MG PO TABS: 1.0000 | ORAL_TABLET | ORAL | 0 refills | Status: AC | PRN

## 2023-03-08 MED ORDER — IBUPROFEN 800 MG PO TABS: 800.0000 mg | ORAL_TABLET | Freq: Four times a day (QID) | ORAL | 1 refills | Status: AC | PRN

## 2023-03-17 ENCOUNTER — Ambulatory Visit (INDEPENDENT_AMBULATORY_CARE_PROVIDER_SITE_OTHER): Payer: No Typology Code available for payment source

## 2023-03-17 ENCOUNTER — Other Ambulatory Visit: Payer: No Typology Code available for payment source

## 2023-03-17 ENCOUNTER — Ambulatory Visit (INDEPENDENT_AMBULATORY_CARE_PROVIDER_SITE_OTHER): Payer: No Typology Code available for payment source | Admitting: Podiatry

## 2023-03-17 ENCOUNTER — Encounter: Payer: Self-pay | Admitting: Podiatry

## 2023-03-17 DIAGNOSIS — M216X2 Other acquired deformities of left foot: Secondary | ICD-10-CM

## 2023-03-17 DIAGNOSIS — Z9889 Other specified postprocedural states: Secondary | ICD-10-CM

## 2023-03-17 MED ORDER — OXYCODONE-ACETAMINOPHEN 5-325 MG PO TABS: 1.0000 | ORAL_TABLET | ORAL | 0 refills | Status: AC | PRN

## 2023-03-17 NOTE — Progress Notes (Signed)
Subjective:  Patient ID: Sean Olson, male    DOB: June 11, 1981,  MRN: [id number]  Chief Complaint  Patient presents with   Routine Post Op    POV #1 DOS 03/08/2023 LT 5TH METATARSAL FLOATING OSTEOTOMY    DOS: 03/08/23 Procedure: Left fith floating osteotomy  42 y.o. male returns for post-op check.  He states is doing okay.  No acute complaints.  Bandages clean dry and intact.  No nausea fever chills vomiting.  Review of Systems: Negative except as noted in the HPI. Denies N/V/F/Ch.  Past Medical History:  Diagnosis Date   Asthma    Hypertension    Ulcer     Current Outpatient Medications:    brompheniramine-pseudoephedrine-DM 30-2-10 MG/5ML syrup, 10 mL PO AT BEDTIME.   MAY REPEAT 6 HRS LATER AS NEEDED, Disp: , Rfl:    oxyCODONE-acetaminophen (PERCOCET) 5-325 MG tablet, Take 1 tablet by mouth every 4 (four) hours as needed for severe pain., Disp: 30 tablet, Rfl: 0   PAXLOVID, 300/100, 20 x 150 MG & 10 x 100MG  TBPK, Take by mouth., Disp: , Rfl:    promethazine-dextromethorphan (PROMETHAZINE-DM) 6.25-15 MG/5ML syrup, Take by mouth., Disp: , Rfl:    albuterol (VENTOLIN HFA) 108 (90 Base) MCG/ACT inhaler, Inhale 2 puffs into the lungs every 6 (six) hours as needed for wheezing or shortness of breath., Disp: 8 g, Rfl: 2   amLODipine (NORVASC) 5 MG tablet, Take 5 mg by mouth daily., Disp: , Rfl:    amLODipine-olmesartan (AZOR) 5-40 MG tablet, TAKE 1 TABLET BY MOUTH ONCE DAILY, Disp: 90 tablet, Rfl: 4   B Complex-C (SUPER B COMPLEX PO), Take 1 tablet by mouth daily.  , Disp: , Rfl:    benzonatate (TESSALON) 100 MG capsule, Take 1 capsule (100 mg total) by mouth 3 (three) times daily as needed for cough, Disp: 90 capsule, Rfl: 1   benzonatate (TESSALON) 200 MG capsule, Take 1 capsule (200 mg total) by mouth 3 (three) times daily as directed., Disp: 45 capsule, Rfl: 0   cefdinir (OMNICEF) 300 MG capsule, Take 1 capsule (300 mg total) by mouth 2 (two) times daily for 10 days., Disp: 20  capsule, Rfl: 0   doxycycline (VIBRAMYCIN) 100 MG capsule, Take 1 capsule (100 mg total) by mouth 2 (two) times daily., Disp: 20 capsule, Rfl: 0   fluticasone (FLONASE) 50 MCG/ACT nasal spray, Place 2 sprays into both nostrils daily for 10 days., Disp: 16 g, Rfl: 0   hydrochlorothiazide (HYDRODIURIL) 25 MG tablet, Take 25 mg by mouth daily.  , Disp: , Rfl:    hydrochlorothiazide (HYDRODIURIL) 25 MG tablet, TAKE 1 TABLET BY MOUTH ONCE DAILY, Disp: 90 tablet, Rfl: 4   ibuprofen (ADVIL) 800 MG tablet, Take 1 tablet (800 mg total) by mouth every 6 (six) hours as needed., Disp: 60 tablet, Rfl: 1   meloxicam (MOBIC) 15 MG tablet, Take 1 tablet by mouth once a day with meals, Disp: 30 tablet, Rfl: 1   methocarbamol (ROBAXIN) 500 MG tablet, Take 1 tablet by mouth twice a day as needed for spasms, Disp: 60 tablet, Rfl: 0   methocarbamol (ROBAXIN) 500 MG tablet, Take 1 tablet by mouth twice a day as needed for spasms, Disp: 60 tablet, Rfl: 0   molnupiravir EUA (LAGEVRIO) 200 MG CAPS capsule, Take 4 capsules every 12 hours for 5 days., Disp: 40 capsule, Rfl: 0   Multiple Vitamins-Minerals (MULTIVITAMINS THER. W/MINERALS) TABS, Take 1 tablet by mouth daily.  , Disp: , Rfl:  olmesartan (BENICAR) 20 MG tablet, Take 20 mg by mouth daily., Disp: , Rfl:    omeprazole (PRILOSEC) 20 MG capsule, Take 20 mg by mouth daily.  , Disp: , Rfl:    omeprazole (PRILOSEC) 20 MG capsule, TAKE 1 CAPSULE BY MOUTH  ONCE DAILY, Disp: 90 capsule, Rfl: 4   oseltamivir (TAMIFLU) 75 MG capsule, Take 1 capsule by mouth once a day for 10 days (flu exposure), Disp: 10 capsule, Rfl: 0   pravastatin (PRAVACHOL) 20 MG tablet, Take 20 mg by mouth daily., Disp: , Rfl:    predniSONE (DELTASONE) 50 MG tablet, One tablet a day for 5 days, Disp: 5 tablet, Rfl: 0   Probiotic Product (PROBIOTIC DAILY PO), Take 1 capsule by mouth daily., Disp: , Rfl:    promethazine-codeine (PHENERGAN WITH CODEINE) 6.25-10 MG/5ML syrup, Take 5 mL every 6 hours as  needed, Disp: 100 mL, Rfl: 0   rosuvastatin (CRESTOR) 20 MG tablet, TAKE 1 TABLET BY MOUTH ONCE DAILY, Disp: 90 tablet, Rfl: 4  Social History   Tobacco Use  Smoking Status Never  Smokeless Tobacco Not on file    No Known Allergies Objective:  There were no vitals filed for this visit. There is no height or weight on file to calculate BMI. Constitutional Well developed. Well nourished.  Vascular Foot warm and well perfused. Capillary refill normal to all digits.   Neurologic Normal speech. Oriented to person, place, and time. Epicritic sensation to light touch grossly present bilaterally.  Dermatologic Skin healing well without signs of infection. Skin edges well coapted without signs of infection.  Orthopedic: Tenderness to palpation noted about the surgical site.   Radiographs: 3 views of skeletally mature adult left foot: Good correction alignment noted reduction of plantarflexed metatarsal noted. Assessment:   1. Plantar flexed metatarsal, left   2. Status post foot surgery    Plan:  Patient was evaluated and treated and all questions answered.  S/p foot surgery left -Progressing as expected post-operatively. -XR: See above -WB Status: Weightbearing as tolerated in surgical shoe -Sutures: Intact.  No clinical signs of Deis is noted no complication noted. -Medications: None -Foot redressed.  No follow-ups on file.

## 2023-03-31 ENCOUNTER — Ambulatory Visit (INDEPENDENT_AMBULATORY_CARE_PROVIDER_SITE_OTHER): Payer: No Typology Code available for payment source | Admitting: Podiatry

## 2023-03-31 DIAGNOSIS — Z9889 Other specified postprocedural states: Secondary | ICD-10-CM

## 2023-03-31 DIAGNOSIS — M216X2 Other acquired deformities of left foot: Secondary | ICD-10-CM

## 2023-03-31 NOTE — Progress Notes (Signed)
Subjective:  Patient ID: Sean Olson, male    DOB: 11-Dec-1980,  MRN: [id number]  Chief Complaint  Patient presents with   Routine Post Op    POV #2 DOS 03/08/2023 LT 5TH METATARSAL FLOATING OSTEOTOMY (PUO)    DOS: 03/08/23 Procedure: Left fith floating osteotomy  42 y.o. male returns for post-op check.  He states is doing okay.  No acute complaints.  Bandages clean dry and intact.  No nausea fever chills vomiting.  Review of Systems: Negative except as noted in the HPI. Denies N/V/F/Ch.  Past Medical History:  Diagnosis Date   Asthma    Hypertension    Ulcer     Current Outpatient Medications:    albuterol (VENTOLIN HFA) 108 (90 Base) MCG/ACT inhaler, Inhale 2 puffs into the lungs every 6 (six) hours as needed for wheezing or shortness of breath., Disp: 8 g, Rfl: 2   amLODipine-olmesartan (AZOR) 5-40 MG tablet, TAKE 1 TABLET BY MOUTH ONCE DAILY, Disp: 90 tablet, Rfl: 4   B Complex-C (SUPER B COMPLEX PO), Take 1 tablet by mouth daily.  , Disp: , Rfl:    hydrochlorothiazide (HYDRODIURIL) 25 MG tablet, TAKE 1 TABLET BY MOUTH ONCE DAILY, Disp: 90 tablet, Rfl: 4   ibuprofen (ADVIL) 800 MG tablet, Take 1 tablet (800 mg total) by mouth every 6 (six) hours as needed., Disp: 60 tablet, Rfl: 1   Multiple Vitamins-Minerals (MULTIVITAMINS THER. W/MINERALS) TABS, Take 1 tablet by mouth daily.  , Disp: , Rfl:    omeprazole (PRILOSEC) 20 MG capsule, TAKE 1 CAPSULE BY MOUTH  ONCE DAILY, Disp: 90 capsule, Rfl: 4   oxyCODONE-acetaminophen (PERCOCET) 5-325 MG tablet, Take 1 tablet by mouth every 4 (four) hours as needed for severe pain., Disp: 30 tablet, Rfl: 0   rosuvastatin (CRESTOR) 20 MG tablet, TAKE 1 TABLET BY MOUTH ONCE DAILY, Disp: 90 tablet, Rfl: 4   amLODipine (NORVASC) 5 MG tablet, Take 5 mg by mouth daily., Disp: , Rfl:    benzonatate (TESSALON) 100 MG capsule, Take 1 capsule (100 mg total) by mouth 3 (three) times daily as needed for cough, Disp: 90 capsule, Rfl: 1   benzonatate  (TESSALON) 200 MG capsule, Take 1 capsule (200 mg total) by mouth 3 (three) times daily as directed., Disp: 45 capsule, Rfl: 0   brompheniramine-pseudoephedrine-DM 30-2-10 MG/5ML syrup, 10 mL PO AT BEDTIME.   MAY REPEAT 6 HRS LATER AS NEEDED, Disp: , Rfl:    cefdinir (OMNICEF) 300 MG capsule, Take 1 capsule (300 mg total) by mouth 2 (two) times daily for 10 days., Disp: 20 capsule, Rfl: 0   doxycycline (VIBRAMYCIN) 100 MG capsule, Take 1 capsule (100 mg total) by mouth 2 (two) times daily., Disp: 20 capsule, Rfl: 0   fluticasone (FLONASE) 50 MCG/ACT nasal spray, Place 2 sprays into both nostrils daily for 10 days., Disp: 16 g, Rfl: 0   hydrochlorothiazide (HYDRODIURIL) 25 MG tablet, Take 25 mg by mouth daily.  , Disp: , Rfl:    meloxicam (MOBIC) 15 MG tablet, Take 1 tablet by mouth once a day with meals, Disp: 30 tablet, Rfl: 1   methocarbamol (ROBAXIN) 500 MG tablet, Take 1 tablet by mouth twice a day as needed for spasms, Disp: 60 tablet, Rfl: 0   methocarbamol (ROBAXIN) 500 MG tablet, Take 1 tablet by mouth twice a day as needed for spasms, Disp: 60 tablet, Rfl: 0   molnupiravir EUA (LAGEVRIO) 200 MG CAPS capsule, Take 4 capsules every 12 hours for 5 days., Disp:  40 capsule, Rfl: 0   olmesartan (BENICAR) 20 MG tablet, Take 20 mg by mouth daily., Disp: , Rfl:    omeprazole (PRILOSEC) 20 MG capsule, Take 20 mg by mouth daily.  , Disp: , Rfl:    oseltamivir (TAMIFLU) 75 MG capsule, Take 1 capsule by mouth once a day for 10 days (flu exposure), Disp: 10 capsule, Rfl: 0   PAXLOVID, 300/100, 20 x 150 MG & 10 x 100MG  TBPK, Take by mouth., Disp: , Rfl:    pravastatin (PRAVACHOL) 20 MG tablet, Take 20 mg by mouth daily., Disp: , Rfl:    predniSONE (DELTASONE) 50 MG tablet, One tablet a day for 5 days, Disp: 5 tablet, Rfl: 0   Probiotic Product (PROBIOTIC DAILY PO), Take 1 capsule by mouth daily., Disp: , Rfl:    promethazine-codeine (PHENERGAN WITH CODEINE) 6.25-10 MG/5ML syrup, Take 5 mL every 6 hours as  needed, Disp: 100 mL, Rfl: 0   promethazine-dextromethorphan (PROMETHAZINE-DM) 6.25-15 MG/5ML syrup, Take by mouth., Disp: , Rfl:   Social History   Tobacco Use  Smoking Status Never  Smokeless Tobacco Not on file    No Known Allergies Objective:  There were no vitals filed for this visit. There is no height or weight on file to calculate BMI. Constitutional Well developed. Well nourished.  Vascular Foot warm and well perfused. Capillary refill normal to all digits.   Neurologic Normal speech. Oriented to person, place, and time. Epicritic sensation to light touch grossly present bilaterally.  Dermatologic Skin healing well without signs of infection. Skin edges well coapted without signs of infection.  Orthopedic: Tenderness to palpation noted about the surgical site.   Radiographs: 3 views of skeletally mature adult left foot: Good correction alignment noted reduction of plantarflexed metatarsal noted. Assessment:   No diagnosis found.  Plan:  Patient was evaluated and treated and all questions answered.  S/p foot surgery left -Clinically healed and officially discharged from my care.  If any foot and ankle issues on future he will come back and see me.  I discussed the importance of shoe gear modification and orthotics.  Orthotics were dispensed.  Orthotics were fitted well in the shoes no complication noted.  I did start discussed with him the break-in period.  No follow-ups on file.

## 2023-04-22 ENCOUNTER — Other Ambulatory Visit (HOSPITAL_COMMUNITY): Payer: Self-pay

## 2023-04-22 MED ORDER — ROSUVASTATIN CALCIUM 20 MG PO TABS
20.0000 mg | ORAL_TABLET | Freq: Every day | ORAL | 4 refills | Status: AC
  Filled 2023-04-22: qty 30, 30d supply, fill #0
  Filled 2023-05-26 – 2023-05-27 (×2): qty 30, 30d supply, fill #1
  Filled 2023-06-21: qty 30, 30d supply, fill #2
  Filled 2023-07-20: qty 30, 30d supply, fill #3

## 2023-04-22 MED ORDER — OMEPRAZOLE 20 MG PO CPDR
20.0000 mg | DELAYED_RELEASE_CAPSULE | Freq: Every day | ORAL | 4 refills | Status: AC
  Filled 2023-04-22: qty 30, 30d supply, fill #0
  Filled 2023-05-26 – 2023-05-27 (×2): qty 30, 30d supply, fill #1
  Filled 2023-06-21: qty 30, 30d supply, fill #2
  Filled 2023-07-20: qty 30, 30d supply, fill #3

## 2023-04-22 MED ORDER — HYDROCHLOROTHIAZIDE 25 MG PO TABS
25.0000 mg | ORAL_TABLET | Freq: Every day | ORAL | 4 refills | Status: AC
  Filled 2023-04-22: qty 30, 30d supply, fill #0
  Filled 2023-05-26 – 2023-05-27 (×2): qty 30, 30d supply, fill #1
  Filled 2023-06-21: qty 30, 30d supply, fill #2
  Filled 2023-07-20: qty 30, 30d supply, fill #3

## 2023-04-22 MED ORDER — AMLODIPINE-OLMESARTAN 5-40 MG PO TABS
1.0000 | ORAL_TABLET | Freq: Every day | ORAL | 4 refills | Status: AC
  Filled 2023-04-22: qty 30, 30d supply, fill #0
  Filled 2023-04-22: qty 90, 90d supply, fill #1
  Filled 2023-05-26 – 2023-05-27 (×2): qty 30, 30d supply, fill #1
  Filled 2023-06-21: qty 30, 30d supply, fill #2
  Filled 2023-07-20: qty 30, 30d supply, fill #3

## 2023-04-23 ENCOUNTER — Other Ambulatory Visit (HOSPITAL_COMMUNITY): Payer: Self-pay

## 2023-05-01 ENCOUNTER — Other Ambulatory Visit (HOSPITAL_COMMUNITY): Payer: Self-pay

## 2023-05-01 MED ORDER — LIDOCAINE VISCOUS HCL 2 % MT SOLN
OROMUCOSAL | 0 refills | Status: AC
  Filled 2023-05-01: qty 100, 6d supply, fill #0

## 2023-05-01 MED ORDER — ALBUTEROL SULFATE HFA 108 (90 BASE) MCG/ACT IN AERS
2.0000 | INHALATION_SPRAY | Freq: Four times a day (QID) | RESPIRATORY_TRACT | 0 refills | Status: AC | PRN
  Filled 2023-05-01: qty 6.7, 25d supply, fill #0

## 2023-05-01 MED ORDER — PROMETHAZINE-DM 6.25-15 MG/5ML PO SYRP
ORAL_SOLUTION | ORAL | 0 refills | Status: AC
  Filled 2023-05-01: qty 120, 8d supply, fill #0

## 2023-05-01 MED ORDER — BENZONATATE 200 MG PO CAPS
200.0000 mg | ORAL_CAPSULE | Freq: Three times a day (TID) | ORAL | 0 refills | Status: AC | PRN
  Filled 2023-05-01: qty 20, 7d supply, fill #0

## 2023-05-26 ENCOUNTER — Other Ambulatory Visit (HOSPITAL_BASED_OUTPATIENT_CLINIC_OR_DEPARTMENT_OTHER): Payer: Self-pay

## 2023-05-27 ENCOUNTER — Other Ambulatory Visit (HOSPITAL_COMMUNITY): Payer: Self-pay

## 2023-05-28 ENCOUNTER — Other Ambulatory Visit (HOSPITAL_BASED_OUTPATIENT_CLINIC_OR_DEPARTMENT_OTHER): Payer: Self-pay

## 2023-05-28 ENCOUNTER — Other Ambulatory Visit (HOSPITAL_COMMUNITY): Payer: Self-pay

## 2023-06-08 ENCOUNTER — Other Ambulatory Visit (HOSPITAL_COMMUNITY): Payer: Self-pay

## 2023-06-09 ENCOUNTER — Other Ambulatory Visit (HOSPITAL_COMMUNITY): Payer: Self-pay

## 2023-06-09 MED ORDER — MOUNJARO 5 MG/0.5ML ~~LOC~~ SOAJ
5.0000 mg | SUBCUTANEOUS | 3 refills | Status: AC
  Filled 2023-06-09 – 2023-06-14 (×4): qty 2, 28d supply, fill #0
  Filled 2023-07-08: qty 2, 28d supply, fill #1
  Filled 2023-07-21 – 2023-08-05 (×3): qty 2, 28d supply, fill #2

## 2023-06-09 MED ORDER — MOUNJARO 2.5 MG/0.5ML ~~LOC~~ SOAJ
2.5000 mg | SUBCUTANEOUS | 0 refills | Status: AC
  Filled 2023-06-09: qty 2, 28d supply, fill #0

## 2023-06-10 ENCOUNTER — Other Ambulatory Visit (HOSPITAL_COMMUNITY): Payer: Self-pay

## 2023-06-11 ENCOUNTER — Other Ambulatory Visit (HOSPITAL_COMMUNITY): Payer: Self-pay

## 2023-06-12 ENCOUNTER — Other Ambulatory Visit (HOSPITAL_COMMUNITY): Payer: Self-pay

## 2023-06-14 ENCOUNTER — Other Ambulatory Visit: Payer: Self-pay

## 2023-06-14 ENCOUNTER — Other Ambulatory Visit (HOSPITAL_COMMUNITY): Payer: Self-pay

## 2023-06-17 ENCOUNTER — Other Ambulatory Visit (HOSPITAL_COMMUNITY): Payer: Self-pay

## 2023-06-17 MED ORDER — COVID-19 MRNA VAC-TRIS(PFIZER) 30 MCG/0.3ML IM SUSY
0.3000 mL | PREFILLED_SYRINGE | Freq: Once | INTRAMUSCULAR | 0 refills | Status: AC
  Filled 2023-06-17: qty 0.3, 1d supply, fill #0

## 2023-06-24 ENCOUNTER — Other Ambulatory Visit (HOSPITAL_COMMUNITY): Payer: Self-pay

## 2023-07-01 ENCOUNTER — Other Ambulatory Visit (HOSPITAL_BASED_OUTPATIENT_CLINIC_OR_DEPARTMENT_OTHER): Payer: Self-pay

## 2023-07-01 ENCOUNTER — Encounter (HOSPITAL_BASED_OUTPATIENT_CLINIC_OR_DEPARTMENT_OTHER): Payer: Self-pay

## 2023-07-01 DIAGNOSIS — R5383 Other fatigue: Secondary | ICD-10-CM

## 2023-07-01 DIAGNOSIS — R0683 Snoring: Secondary | ICD-10-CM

## 2023-07-01 DIAGNOSIS — R0681 Apnea, not elsewhere classified: Secondary | ICD-10-CM

## 2023-07-09 ENCOUNTER — Other Ambulatory Visit (HOSPITAL_COMMUNITY): Payer: Self-pay

## 2023-07-09 ENCOUNTER — Encounter (HOSPITAL_BASED_OUTPATIENT_CLINIC_OR_DEPARTMENT_OTHER): Payer: No Typology Code available for payment source | Admitting: Internal Medicine

## 2023-07-21 ENCOUNTER — Other Ambulatory Visit (HOSPITAL_COMMUNITY): Payer: Self-pay

## 2023-07-22 ENCOUNTER — Other Ambulatory Visit (HOSPITAL_COMMUNITY): Payer: Self-pay

## 2023-07-26 ENCOUNTER — Other Ambulatory Visit (HOSPITAL_COMMUNITY): Payer: Self-pay

## 2023-08-04 ENCOUNTER — Other Ambulatory Visit (HOSPITAL_COMMUNITY): Payer: Self-pay

## 2023-08-05 ENCOUNTER — Other Ambulatory Visit (HOSPITAL_COMMUNITY): Payer: Self-pay

## 2023-08-06 ENCOUNTER — Other Ambulatory Visit: Payer: Self-pay

## 2023-08-06 ENCOUNTER — Other Ambulatory Visit (HOSPITAL_COMMUNITY): Payer: Self-pay

## 2023-08-07 ENCOUNTER — Other Ambulatory Visit (HOSPITAL_COMMUNITY): Payer: Self-pay

## 2023-10-28 ENCOUNTER — Other Ambulatory Visit (HOSPITAL_COMMUNITY): Payer: Self-pay

## 2024-06-19 ENCOUNTER — Other Ambulatory Visit: Payer: Self-pay | Admitting: Internal Medicine

## 2024-06-19 DIAGNOSIS — R7401 Elevation of levels of liver transaminase levels: Secondary | ICD-10-CM

## 2024-07-05 ENCOUNTER — Ambulatory Visit
Admission: RE | Admit: 2024-07-05 | Discharge: 2024-07-05 | Disposition: A | Discharge status: Home / Self Care | Source: Ambulatory Visit | Attending: Internal Medicine | Admitting: Internal Medicine

## 2024-07-05 DIAGNOSIS — R7401 Elevation of levels of liver transaminase levels: Secondary | ICD-10-CM

## 2024-08-28 ENCOUNTER — Other Ambulatory Visit: Payer: Self-pay

## 2024-08-28 MED ORDER — MELOXICAM 15 MG PO TABS
15.0000 mg | ORAL_TABLET | Freq: Every day | ORAL | 0 refills | Status: DC | PRN
  Filled 2024-08-28: qty 30, 30d supply, fill #0
